# Patient Record
Sex: Female | Born: 1995 | Race: Black or African American | Hispanic: No | Marital: Single | Smoking: Never smoker
Health system: Southern US, Community
[De-identification: ages and names within clinical notes are randomized; demographics above are authoritative.]

## PROBLEM LIST (undated history)

## (undated) DIAGNOSIS — S82852S Displaced trimalleolar fracture of left lower leg, sequela: Secondary | ICD-10-CM

---

## 2020-08-19 ENCOUNTER — Ambulatory Visit (INDEPENDENT_AMBULATORY_CARE_PROVIDER_SITE_OTHER): Payer: No Typology Code available for payment source

## 2020-08-19 ENCOUNTER — Ambulatory Visit: Payer: Self-pay | Admitting: Family Medicine

## 2020-08-19 ENCOUNTER — Other Ambulatory Visit: Payer: Self-pay

## 2020-08-19 ENCOUNTER — Encounter (HOSPITAL_BASED_OUTPATIENT_CLINIC_OR_DEPARTMENT_OTHER): Payer: Self-pay | Admitting: Orthopaedic Surgery

## 2020-08-19 ENCOUNTER — Ambulatory Visit (INDEPENDENT_AMBULATORY_CARE_PROVIDER_SITE_OTHER): Payer: No Typology Code available for payment source | Admitting: Orthopaedic Surgery

## 2020-08-19 DIAGNOSIS — S82892A Other fracture of left lower leg, initial encounter for closed fracture: Secondary | ICD-10-CM | POA: Diagnosis not present

## 2020-08-19 NOTE — Progress Notes (Signed)
Office Visit Note   Patient: Carol Daugherty           Date of Birth: November 19, 1995           MRN: [id number] Visit Date: 08/19/2020              Requested by: No referring provider defined for this encounter. PCP: Pcp, No   Assessment & Plan: Visit Diagnoses:  1. Closed fracture of left ankle, initial encounter     Plan: My impression is left ankle fracture dislocation.  This will need to be surgically stabilized with external fixator as soon as possible.  We will plan for outpatient surgery and then follow-up a week after to check to swelling.  She will need definitive surgical repair once the swelling allows.  Anticipated time out of work is at least 3 to 4 months from the definitive surgery.  Risk benefits rehab recovery time reviewed with the patient.  She was placed back into the splint.  Total face to face encounter time was greater than 45 minutes and over half of this time was spent in counseling and/or coordination of care.  Follow-Up Instructions: Return for 1 week.   Orders:  Orders Placed This Encounter  Procedures   XR Ankle Complete Left   No orders of the defined types were placed in this encounter.     Procedures: No procedures performed   Clinical Data: No additional findings.   Subjective: Chief Complaint  Patient presents with   Left Ankle - Fracture    Carol Daugherty is a 25 year old female who presents here today for acute left ankle injury that she suffered couple days ago at work.  She is evaluated in the [facility] ER and placed in a splint in situ.  She comes in today to see our nurse practitioner Barnie Del who asked me to evaluate the patient.  She has been nonweightbearing since the injury.  Endorses pain and swelling in the left ankle.   Review of Systems  Constitutional: Negative.   HENT: Negative.    Eyes: Negative.   Respiratory: Negative.    Cardiovascular: Negative.   Endocrine: Negative.   Musculoskeletal: Negative.   Neurological:  Negative.   Hematological: Negative.   Psychiatric/Behavioral: Negative.    All other systems reviewed and are negative.   Objective: Vital Signs: LMP 08/05/2020 (Exact Date)   Physical Exam Vitals and nursing note reviewed.  Constitutional:      Appearance: She is well-developed.  HENT:     Head: Normocephalic and atraumatic.  Pulmonary:     Effort: Pulmonary effort is normal.  Abdominal:     Palpations: Abdomen is soft.  Musculoskeletal:     Cervical back: Neck supple.  Skin:    General: Skin is warm.     Capillary Refill: Capillary refill takes less than 2 seconds.  Neurological:     Mental Status: She is alert and oriented to person, place, and time.  Psychiatric:        Behavior: Behavior normal.        Thought Content: Thought content normal.        Judgment: Judgment normal.    Ortho Exam Left ankle shows fracture blister on the medial aspect overlying the medial malleolus.  There is no neurovascular compromise.  She has significant swelling to the foot and ankle. Specialty Comments:  No specialty comments available.  Imaging: XR Ankle Complete Left  Result Date: 08/19/2020 Dislocated ankle fracture dislocation    PMFS History: There  are no problems to display for this patient.  No past medical history on file.  No family history on file.  No past surgical history on file. Social History   Occupational History   Not on file  Tobacco Use   Smoking status: Never   Smokeless tobacco: Never  Vaping Use   Vaping Use: Never used  Substance and Sexual Activity   Alcohol use: Never   Drug use: Never   Sexual activity: Not on file

## 2020-08-20 ENCOUNTER — Ambulatory Visit (HOSPITAL_BASED_OUTPATIENT_CLINIC_OR_DEPARTMENT_OTHER): Payer: No Typology Code available for payment source | Admitting: Anesthesiology

## 2020-08-20 ENCOUNTER — Other Ambulatory Visit: Payer: Self-pay

## 2020-08-20 ENCOUNTER — Encounter (HOSPITAL_BASED_OUTPATIENT_CLINIC_OR_DEPARTMENT_OTHER)
Admission: RE | Disposition: A | Discharge status: Home / Self Care | Payer: Self-pay | Source: Home / Self Care | Attending: Orthopaedic Surgery

## 2020-08-20 ENCOUNTER — Ambulatory Visit (HOSPITAL_BASED_OUTPATIENT_CLINIC_OR_DEPARTMENT_OTHER)
Admission: RE | Admit: 2020-08-20 | Discharge: 2020-08-20 | Disposition: A | Discharge status: Home / Self Care | Payer: No Typology Code available for payment source | Attending: Orthopaedic Surgery | Admitting: Orthopaedic Surgery

## 2020-08-20 ENCOUNTER — Encounter (HOSPITAL_BASED_OUTPATIENT_CLINIC_OR_DEPARTMENT_OTHER): Payer: Self-pay | Admitting: Orthopaedic Surgery

## 2020-08-20 ENCOUNTER — Ambulatory Visit (HOSPITAL_COMMUNITY): Payer: No Typology Code available for payment source

## 2020-08-20 DIAGNOSIS — X58XXXA Exposure to other specified factors, initial encounter: Secondary | ICD-10-CM | POA: Diagnosis not present

## 2020-08-20 DIAGNOSIS — S9305XA Dislocation of left ankle joint, initial encounter: Secondary | ICD-10-CM | POA: Diagnosis not present

## 2020-08-20 DIAGNOSIS — S82852A Displaced trimalleolar fracture of left lower leg, initial encounter for closed fracture: Secondary | ICD-10-CM | POA: Insufficient documentation

## 2020-08-20 DIAGNOSIS — Z419 Encounter for procedure for purposes other than remedying health state, unspecified: Secondary | ICD-10-CM

## 2020-08-20 DIAGNOSIS — S82892A Other fracture of left lower leg, initial encounter for closed fracture: Secondary | ICD-10-CM

## 2020-08-20 HISTORY — PX: EXTERNAL FIXATION LEG: SHX1549

## 2020-08-20 SURGERY — EXTERNAL FIXATION, LOWER EXTREMITY
Anesthesia: General | Site: Ankle | Laterality: Left

## 2020-08-20 MED ORDER — FENTANYL CITRATE (PF) 100 MCG/2ML IJ SOLN
INTRAMUSCULAR | Status: AC
  Filled 2020-08-20: qty 2

## 2020-08-20 MED ORDER — AMISULPRIDE (ANTIEMETIC) 5 MG/2ML IV SOLN: 10.0000 mg | Freq: Once | INTRAVENOUS | Status: DC | PRN

## 2020-08-20 MED ORDER — MIDAZOLAM HCL 2 MG/2ML IJ SOLN
INTRAMUSCULAR | Status: AC
  Filled 2020-08-20: qty 2

## 2020-08-20 MED ORDER — PROPOFOL 10 MG/ML IV BOLUS
INTRAVENOUS | Status: DC | PRN
  Administered 2020-08-20: 200 mg via INTRAVENOUS

## 2020-08-20 MED ORDER — ONDANSETRON HCL 4 MG/2ML IJ SOLN
INTRAMUSCULAR | Status: DC | PRN
  Administered 2020-08-20: 4 mg via INTRAVENOUS

## 2020-08-20 MED ORDER — OXYCODONE HCL 5 MG PO TABS: 5.0000 mg | ORAL_TABLET | Freq: Once | ORAL | Status: DC | PRN

## 2020-08-20 MED ORDER — HYDROMORPHONE HCL 1 MG/ML IJ SOLN
INTRAMUSCULAR | Status: AC
  Filled 2020-08-20: qty 0.5

## 2020-08-20 MED ORDER — CEFAZOLIN SODIUM-DEXTROSE 2-4 GM/100ML-% IV SOLN
2.0000 g | INTRAVENOUS | Status: AC
  Administered 2020-08-20: 2 g via INTRAVENOUS

## 2020-08-20 MED ORDER — HYDROMORPHONE HCL 1 MG/ML IJ SOLN
0.2500 mg | INTRAMUSCULAR | Status: DC | PRN
  Administered 2020-08-20 (×2): 0.5 mg via INTRAVENOUS

## 2020-08-20 MED ORDER — ASPIRIN EC 81 MG PO TBEC: 81.0000 mg | DELAYED_RELEASE_TABLET | Freq: Every day | ORAL | 0 refills | Status: AC

## 2020-08-20 MED ORDER — CEFAZOLIN SODIUM-DEXTROSE 2-4 GM/100ML-% IV SOLN
INTRAVENOUS | Status: AC
  Filled 2020-08-20: qty 100

## 2020-08-20 MED ORDER — OXYCODONE HCL 5 MG/5ML PO SOLN: 5.0000 mg | Freq: Once | ORAL | Status: DC | PRN

## 2020-08-20 MED ORDER — PROMETHAZINE HCL 25 MG/ML IJ SOLN: 6.2500 mg | INTRAMUSCULAR | Status: DC | PRN

## 2020-08-20 MED ORDER — HYDROCODONE-ACETAMINOPHEN 5-325 MG PO TABS: 1.0000 | ORAL_TABLET | Freq: Four times a day (QID) | ORAL | 0 refills | Status: AC | PRN

## 2020-08-20 MED ORDER — DEXAMETHASONE SODIUM PHOSPHATE 10 MG/ML IJ SOLN
INTRAMUSCULAR | Status: DC | PRN
  Administered 2020-08-20: 10 mg via INTRAVENOUS

## 2020-08-20 MED ORDER — MIDAZOLAM HCL 5 MG/5ML IJ SOLN
INTRAMUSCULAR | Status: DC | PRN
  Administered 2020-08-20 (×2): 1 mg via INTRAVENOUS
  Administered 2020-08-20: 2 mg via INTRAVENOUS

## 2020-08-20 MED ORDER — LIDOCAINE HCL (CARDIAC) PF 100 MG/5ML IV SOSY
PREFILLED_SYRINGE | INTRAVENOUS | Status: DC | PRN
  Administered 2020-08-20: 100 mg via INTRAVENOUS

## 2020-08-20 MED ORDER — LACTATED RINGERS IV SOLN: INTRAVENOUS | Status: DC

## 2020-08-20 MED ORDER — MEPERIDINE HCL 25 MG/ML IJ SOLN: 6.2500 mg | INTRAMUSCULAR | Status: DC | PRN

## 2020-08-20 MED ORDER — LIDOCAINE HCL (PF) 2 % IJ SOLN
INTRAMUSCULAR | Status: AC
  Filled 2020-08-20: qty 5

## 2020-08-20 MED ORDER — FENTANYL CITRATE (PF) 100 MCG/2ML IJ SOLN
INTRAMUSCULAR | Status: DC | PRN
  Administered 2020-08-20 (×2): 50 ug via INTRAVENOUS
  Administered 2020-08-20: 100 ug via INTRAVENOUS

## 2020-08-20 SURGICAL SUPPLY — 89 items
BANDAGE ESMARK 6X9 LF (GAUZE/BANDAGES/DRESSINGS) IMPLANT
BAR EXFX 150X11 NS LF (EXFIX) ×2
BAR EXFX 300X11 NS LF (EXFIX) ×2
BAR GLASS FIBER EXFX 11X150 (EXFIX) ×4 IMPLANT
BAR GLASS FIBER EXFX 11X300 (EXFIX) ×4 IMPLANT
BIT DRILL CANN MED FLUTE 4.0 (BIT) ×1 IMPLANT
BLADE HEX COATED 2.75 (ELECTRODE) IMPLANT
BLADE SURG 15 STRL LF DISP TIS (BLADE) ×2 IMPLANT
BLADE SURG 15 STRL SS (BLADE) ×4
BNDG CMPR 9X6 STRL LF SNTH (GAUZE/BANDAGES/DRESSINGS)
BNDG COHESIVE 6X5 TAN STRL LF (GAUZE/BANDAGES/DRESSINGS) IMPLANT
BNDG ELASTIC 4X5.8 VLCR STR LF (GAUZE/BANDAGES/DRESSINGS) ×2 IMPLANT
BNDG ELASTIC 6X5.8 VLCR STR LF (GAUZE/BANDAGES/DRESSINGS) ×2 IMPLANT
BNDG ESMARK 6X9 LF (GAUZE/BANDAGES/DRESSINGS)
BNDG GAUZE ELAST 4 BULKY (GAUZE/BANDAGES/DRESSINGS) ×2 IMPLANT
BRUSH SCRUB EZ PLAIN DRY (MISCELLANEOUS) ×2 IMPLANT
CANISTER SUCT 1200ML W/VALVE (MISCELLANEOUS) ×2 IMPLANT
CLAMP BLUE BAR TO BAR (EXFIX) ×4 IMPLANT
CLAMP BLUE BAR TO PIN (EXFIX) ×12 IMPLANT
COVER BACK TABLE 60X90IN (DRAPES) ×2 IMPLANT
COVER MAYO STAND STRL (DRAPES) IMPLANT
CUFF TOURN SGL QUICK 34 (TOURNIQUET CUFF) ×2
CUFF TRNQT CYL 34X4.125X (TOURNIQUET CUFF) ×1 IMPLANT
DECANTER SPIKE VIAL GLASS SM (MISCELLANEOUS) IMPLANT
DRAPE C-ARM 42X72 X-RAY (DRAPES) ×2 IMPLANT
DRAPE C-ARMOR (DRAPES) ×2 IMPLANT
DRAPE EXTREMITY T 121X128X90 (DISPOSABLE) ×2 IMPLANT
DRAPE IMP U-DRAPE 54X76 (DRAPES) ×2 IMPLANT
DRAPE INCISE IOBAN 66X45 STRL (DRAPES) IMPLANT
DRAPE SURG 17X23 STRL (DRAPES) IMPLANT
DRAPE U-SHAPE 47X51 STRL (DRAPES) ×2 IMPLANT
DRILL CANN 4.0MM (BIT) ×2
DRSG PAD ABDOMINAL 8X10 ST (GAUZE/BANDAGES/DRESSINGS) IMPLANT
DURAPREP 26ML APPLICATOR (WOUND CARE) ×4 IMPLANT
ELECT REM PT RETURN 9FT ADLT (ELECTROSURGICAL)
ELECTRODE REM PT RTRN 9FT ADLT (ELECTROSURGICAL) IMPLANT
GAUZE SPONGE 4X4 12PLY STRL (GAUZE/BANDAGES/DRESSINGS) ×2 IMPLANT
GAUZE XEROFORM 1X8 LF (GAUZE/BANDAGES/DRESSINGS) IMPLANT
GAUZE XEROFORM 5X9 LF (GAUZE/BANDAGES/DRESSINGS) ×2 IMPLANT
GLOVE SURG LTX SZ7 (GLOVE) IMPLANT
GLOVE SURG NEOP MICRO LF SZ7.5 (GLOVE) ×2 IMPLANT
GLOVE SURG SYN 7.5  E (GLOVE) ×1
GLOVE SURG SYN 7.5 E (GLOVE) ×1 IMPLANT
GLOVE SURG UNDER POLY LF SZ7 (GLOVE) ×4 IMPLANT
GOWN STRL REIN XL XLG (GOWN DISPOSABLE) ×4 IMPLANT
GOWN STRL REUS W/ TWL LRG LVL3 (GOWN DISPOSABLE) ×1 IMPLANT
GOWN STRL REUS W/ TWL XL LVL3 (GOWN DISPOSABLE) IMPLANT
GOWN STRL REUS W/TWL LRG LVL3 (GOWN DISPOSABLE) ×2
GOWN STRL REUS W/TWL XL LVL3 (GOWN DISPOSABLE)
MANIFOLD NEPTUNE II (INSTRUMENTS) IMPLANT
NEEDLE HYPO 22GX1.5 SAFETY (NEEDLE) IMPLANT
NS IRRIG 1000ML POUR BTL (IV SOLUTION) ×2 IMPLANT
PACK BASIN DAY SURGERY FS (CUSTOM PROCEDURE TRAY) ×2 IMPLANT
PAD CAST 3X4 CTTN HI CHSV (CAST SUPPLIES) IMPLANT
PAD CAST 4YDX4 CTTN HI CHSV (CAST SUPPLIES) IMPLANT
PADDING CAST COTTON 3X4 STRL (CAST SUPPLIES)
PADDING CAST COTTON 4X4 STRL (CAST SUPPLIES)
PADDING CAST COTTON 6X4 STRL (CAST SUPPLIES) IMPLANT
PADDING CAST SYN 6 (CAST SUPPLIES)
PADDING CAST SYNTHETIC 4 (CAST SUPPLIES)
PADDING CAST SYNTHETIC 4X4 STR (CAST SUPPLIES) IMPLANT
PADDING CAST SYNTHETIC 6X4 NS (CAST SUPPLIES) IMPLANT
PENCIL SMOKE EVACUATOR (MISCELLANEOUS) IMPLANT
PIN HALF YELLOW 5X160X35 (EXFIX) ×4 IMPLANT
PIN TRANSFIXING 5.0 (EXFIX) ×2 IMPLANT
SHEET MEDIUM DRAPE 40X70 STRL (DRAPES) ×4 IMPLANT
SLEEVE SCD COMPRESS KNEE MED (STOCKING) ×2 IMPLANT
SPLINT FIBERGLASS 4X30 (CAST SUPPLIES) IMPLANT
SPONGE T-LAP 18X18 ~~LOC~~+RFID (SPONGE) IMPLANT
STOCKINETTE 6  STRL (DRAPES) ×1
STOCKINETTE 6 STRL (DRAPES) ×1 IMPLANT
STOCKINETTE TUBULAR 6 INCH (GAUZE/BANDAGES/DRESSINGS) ×2 IMPLANT
SUCTION FRAZIER HANDLE 10FR (MISCELLANEOUS)
SUCTION TUBE FRAZIER 10FR DISP (MISCELLANEOUS) IMPLANT
SUT ETHILON 3 0 PS 1 (SUTURE) IMPLANT
SUT VIC AB 0 CT1 27 (SUTURE)
SUT VIC AB 0 CT1 27XBRD ANBCTR (SUTURE) IMPLANT
SUT VIC AB 2-0 CT1 27 (SUTURE)
SUT VIC AB 2-0 CT1 TAPERPNT 27 (SUTURE) IMPLANT
SUT VIC AB 3-0 SH 27 (SUTURE)
SUT VIC AB 3-0 SH 27X BRD (SUTURE) IMPLANT
SYR BULB EAR ULCER 3OZ GRN STR (SYRINGE) ×2 IMPLANT
SYR CONTROL 10ML LL (SYRINGE) IMPLANT
TOWEL GREEN STERILE FF (TOWEL DISPOSABLE) ×2 IMPLANT
TRAY DSU PREP LF (CUSTOM PROCEDURE TRAY) ×2 IMPLANT
TUBE CONNECTING 20X1/4 (TUBING) IMPLANT
UNDERPAD 30X36 HEAVY ABSORB (UNDERPADS AND DIAPERS) ×2 IMPLANT
YANKAUER SUCT BULB TIP NO VENT (SUCTIONS) IMPLANT
bar to bar clamp ×4 IMPLANT

## 2020-08-20 NOTE — Transfer of Care (Signed)
Immediate Anesthesia Transfer of Care Note  Patient: Carol Daugherty  Procedure(s) Performed: EXTERNAL FIXATION LEFT ANKLE (Left: Ankle)  Patient Location: PACU  Anesthesia Type:General  Level of Consciousness: awake, alert , oriented, drowsy and patient cooperative  Airway & Oxygen Therapy: Patient Spontanous Breathing and Patient connected to face mask oxygen  Post-op Assessment: Report given to RN and Post -op Vital signs reviewed and stable  Post vital signs: Reviewed and stable  Last Vitals:  Vitals Value Taken Time  BP    Temp    Pulse    Resp    SpO2      Last Pain:  Vitals:   08/20/20 1132  TempSrc: Oral  PainSc: 8       Patients Stated Pain Goal: 6 (08/20/20 1132)  Complications: No notable events documented.

## 2020-08-20 NOTE — Op Note (Signed)
   Date of Surgery: 08/20/2020  INDICATIONS: Carol Daugherty is a 25 y.o.-year-old female who sustained a left ankle fracture dislocation and presented to my office with the ankle dislocated and severe swelling with fracture blister; she was indicated for external fixation due to the displaced and unstable nature of the fracture and came to the operating room today for this procedure. The Patient did consent to the procedure after discussion of the risks and benefits.   PREOPERATIVE DIAGNOSIS: left trimalleolar ankle fracture dislocation  POSTOPERATIVE DIAGNOSIS: Same.  PROCEDURE: External fixation left trimalleolar ankle fracture dislocation.  CPT 20692 multiplane  SURGEON: N. Glee Arvin, M.D.  ASSIST: Starlyn Skeans [place], [place]; necessary for the timely completion of procedure and due to complexity of procedure.  ANESTHESIA: general  IV FLUIDS AND URINE: See anesthesia.  ESTIMATED BLOOD LOSS: minimal mL.  IMPLANTS: Zimmer ex fix  DRAINS: None.  COMPLICATIONS: see description of procedure.  DESCRIPTION OF PROCEDURE: The patient was identified in the preoperative holding area.  The operative site was marked by the surgeon and confirmed by the patient.  He was brought back to the operating room.  Anesthesia was induced by the anesthesia team.  A well padded nonsterile tourniquet was placed. The operative extremity was prepped and draped in standard sterile fashion.  A timeout was performed.  Preoperative antibiotics were given.  The bony landmarks were palpated on the tibial crest and the pin sites were marked on the skin.  Each Schanz pin was placed in the same fashion -- first drilling with the 3.5 mm drill while copiously irrigating, then hand placing the pin.  This was confirmed on x-ray on both views.  The trans-calcaneal pin was placed from medial to lateral using fluoroscopic guidance.  The ex-fix clamps were placed onto pins and the fracture was pulled into the proper alignment.  The  clamps were completely tightened.   Final x-rays were taken in AP and lateral views to confirm the reduction and pin lengths. The wounds were cleaned and dried a final time and a sterile dressing consisting of Xeroform and kerlix was placed.  The patient was then transferred back to the bed and left the operating room in stable condition.  All sponge and instrument counts were correct.  POSTOPERATIVE PLAN: Ms. Shindler will remain non weight bearing with the leg elevated.  she will return to the operating room for definitive fixation when the swelling has gone down.  Ms. Nemmers will receive DVT prophylaxis based on other medications, activity level, and risk ratio of bleeding to thrombosis.  Pin site care will be initiated on postoperative day one.  Mayra Reel, MD [facility] 2:01 PM

## 2020-08-20 NOTE — Anesthesia Procedure Notes (Addendum)
Procedure Name: LMA Insertion Date/Time: 08/20/2020 1:21 PM Performed by: Ronnette Hila, CRNA Pre-anesthesia Checklist: Patient identified, Emergency Drugs available, Suction available and Patient being monitored Patient Re-evaluated:Patient Re-evaluated prior to induction Oxygen Delivery Method: Circle system utilized Preoxygenation: Pre-oxygenation with 100% oxygen Induction Type: IV induction Ventilation: Mask ventilation without difficulty LMA: LMA inserted LMA Size: 4.0 Number of attempts: 1 Airway Equipment and Method: Bite block Placement Confirmation: positive ETCO2 Tube secured with: Tape Dental Injury: Teeth and Oropharynx as per pre-operative assessment

## 2020-08-20 NOTE — Discharge Instructions (Signed)
  Post Anesthesia Home Care Instructions  Activity: Get plenty of rest for the remainder of the day. A responsible individual must stay with you for 24 hours following the procedure.  For the next 24 hours, DO NOT: -Drive a car -Advertising copywriter -Drink alcoholic beverages -Take any medication unless instructed by your physician -Make any legal decisions or sign important papers.  Meals: Start with liquid foods such as gelatin or soup. Progress to regular foods as tolerated. Avoid greasy, spicy, heavy foods. If nausea and/or vomiting occur, drink only clear liquids until the nausea and/or vomiting subsides. Call your physician if vomiting continues.  Special Instructions/Symptoms: Your throat may feel dry or sore from the anesthesia or the breathing tube placed in your throat during surgery. If this causes discomfort, gargle with warm salt water. The discomfort should disappear within 24 hours.  If you had a scopolamine patch placed behind your ear for the management of post- operative nausea and/or vomiting:  1. The medication in the patch is effective for 72 hours, after which it should be removed.  Wrap patch in a tissue and discard in the trash. Wash hands thoroughly with soap and water. 2. You may remove the patch earlier than 72 hours if you experience unpleasant side effects which may include dry mouth, dizziness or visual disturbances. 3. Avoid touching the patch. Wash your hands with soap and water after contact with the patch.     Leave dressing on and dry until followup appointment

## 2020-08-20 NOTE — Anesthesia Preprocedure Evaluation (Signed)
Anesthesia Evaluation  °Patient identified by MRN, date of birth, ID band °Patient awake ° ° ° °Reviewed: °Allergy & Precautions, NPO status , Patient's Chart, lab work & pertinent test results ° °Airway °Mallampati: II ° °TM Distance: >3 FB °Neck ROM: Full ° ° ° Dental °no notable dental hx. ° °  °Pulmonary °neg pulmonary ROS,  °  °Pulmonary exam normal °breath sounds clear to auscultation ° ° ° ° ° ° Cardiovascular °negative cardio ROS °Normal cardiovascular exam °Rhythm:Regular Rate:Normal ° ° °  °Neuro/Psych °negative neurological ROS ° negative psych ROS  ° GI/Hepatic °negative GI ROS, Neg liver ROS,   °Endo/Other  °negative endocrine ROS ° Renal/GU °negative Renal ROS  °negative genitourinary °  °Musculoskeletal °negative musculoskeletal ROS °(+)  ° Abdominal °(+) + obese,   °Peds °negative pediatric ROS °(+)  Hematology °negative hematology ROS °(+)   °Anesthesia Other Findings ° ° Reproductive/Obstetrics °negative OB ROS ° °  ° ° ° ° ° ° ° ° ° ° ° ° ° °  °  ° ° ° ° ° ° ° ° °Anesthesia Physical °Anesthesia Plan ° °ASA: 2 ° °Anesthesia Plan: General  ° °Post-op Pain Management:   ° °Induction: Intravenous ° °PONV Risk Score and Plan: 3 and Ondansetron, Dexamethasone, Midazolam and Treatment may vary due to age or medical condition ° °Airway Management Planned: LMA ° °Additional Equipment:  ° °Intra-op Plan:  ° °Post-operative Plan: Extubation in OR ° °Informed Consent: I have reviewed the patients History and Physical, chart, labs and discussed the procedure including the risks, benefits and alternatives for the proposed anesthesia with the patient or authorized representative who has indicated his/her understanding and acceptance.  ° ° ° °Dental advisory given ° °Plan Discussed with: CRNA ° °Anesthesia Plan Comments:   ° ° ° ° ° ° °Anesthesia Quick Evaluation ° °

## 2020-08-20 NOTE — Anesthesia Postprocedure Evaluation (Signed)
Anesthesia Post Note  Patient: Makaylyn Sinyard  Procedure(s) Performed: EXTERNAL FIXATION LEFT ANKLE (Left: Ankle)     Patient location during evaluation: PACU Anesthesia Type: General Level of consciousness: awake and alert Pain management: pain level controlled Vital Signs Assessment: post-procedure vital signs reviewed and stable Respiratory status: spontaneous breathing, nonlabored ventilation and respiratory function stable Cardiovascular status: blood pressure returned to baseline and stable Postop Assessment: no apparent nausea or vomiting Anesthetic complications: no   No notable events documented.  Last Vitals:  Vitals:   08/20/20 1445 08/20/20 1500  BP: (!) 150/91 (!) 150/96  Pulse: 92 93  Resp: 14 13  Temp:    SpO2: 96% 97%    Last Pain:  Vitals:   08/20/20 1445  TempSrc:   PainSc: 8                  Lowella Curb

## 2020-08-20 NOTE — H&P (Signed)
    PREOPERATIVE H&P  Chief Complaint: left ankle fracture dislocation  HPI: Carol Daugherty is a 25 y.o. female who presents for surgical treatment of left ankle fracture dislocation.  She denies any changes in medical history.  History reviewed. No pertinent past medical history. History reviewed. No pertinent surgical history. Social History   Socioeconomic History   Marital status: Single    Spouse name: Not on file   Number of children: Not on file   Years of education: Not on file   Highest education level: Not on file  Occupational History   Not on file  Tobacco Use   Smoking status: Never   Smokeless tobacco: Never  Vaping Use   Vaping Use: Never used  Substance and Sexual Activity   Alcohol use: Never   Drug use: Never   Sexual activity: Not on file  Other Topics Concern   Not on file  Social History Narrative   Not on file   Social Determinants of Health   Financial Resource Strain: Not on file  Food Insecurity: Not on file  Transportation Needs: Not on file  Physical Activity: Not on file  Stress: Not on file  Social Connections: Not on file   History reviewed. No pertinent family history. Allergies  Allergen Reactions   Food Hives    SEAFOOD   Prior to Admission medications   Medication Sig Start Date End Date Taking? Authorizing Provider  HYDROcodone-acetaminophen (NORCO/VICODIN) 5-325 MG tablet Take 1 tablet by mouth every 6 (six) hours as needed for moderate pain.   Yes [provider]  ondansetron (ZOFRAN) 4 MG tablet Take 4 mg by mouth every 8 (eight) hours as needed for nausea or vomiting.   Yes [provider]     Positive ROS: All other systems have been reviewed and were otherwise negative with the exception of those mentioned in the HPI and as above.  Physical Exam: General: Alert, no acute distress Cardiovascular: No pedal edema Respiratory: No cyanosis, no use of accessory musculature GI: abdomen soft Skin: No  lesions in the area of chief complaint Neurologic: Sensation intact distally Psychiatric: Patient is competent for consent with normal mood and affect Lymphatic: no lymphedema  MUSCULOSKELETAL: exam stable  Assessment: left ankle fracture dislocation  Plan: Plan for Procedure(s): EXTERNAL FIXATION LEFT ANKLE  The risks benefits and alternatives were discussed with the patient including but not limited to the risks of nonoperative treatment, versus surgical intervention including infection, bleeding, nerve injury,  blood clots, cardiopulmonary complications, morbidity, mortality, among others, and they were willing to proceed.   Preoperative templating of the joint replacement has been completed, documented, and submitted to the Operating Room personnel in order to optimize intra-operative equipment management.   Glee Arvin, MD 08/20/2020 12:57 PM

## 2020-08-21 ENCOUNTER — Encounter (HOSPITAL_BASED_OUTPATIENT_CLINIC_OR_DEPARTMENT_OTHER): Payer: Self-pay | Admitting: Orthopaedic Surgery

## 2020-08-22 ENCOUNTER — Ambulatory Visit (INDEPENDENT_AMBULATORY_CARE_PROVIDER_SITE_OTHER): Payer: No Typology Code available for payment source | Admitting: Orthopaedic Surgery

## 2020-08-22 ENCOUNTER — Encounter: Payer: Self-pay | Admitting: Orthopaedic Surgery

## 2020-08-22 DIAGNOSIS — S82892A Other fracture of left lower leg, initial encounter for closed fracture: Secondary | ICD-10-CM

## 2020-08-22 MED ORDER — "XEROFORM PETROLAT GAUZE 1""X8"" EX MISC": CUTANEOUS | 0 refills | Status: AC

## 2020-08-22 NOTE — Progress Notes (Signed)
   Post-Op Visit Note   Patient: Carol Daugherty           Date of Birth: [birth year]           MRN: [id number] Visit Date: 08/22/2020 PCP: Pcp, No   Assessment & Plan:  Chief Complaint:  Chief Complaint  Patient presents with   Left Ankle - Routine Post Op   Visit Diagnoses:  1. Closed fracture of left ankle, initial encounter     Plan: Patient follows up today 2 days status post Ex-Fix left trimalleolar ankle fracture dislocation.  She has no complaints.  Left ankle shows an intact external fixator and the pin sites are hemostatic.  There is no drainage.  Swelling has already improved.  The skin is wrinkling.  Foot is warm and well-perfused without any neurovascular compromise.  We educated the patient on pin site care which she will do twice a day.  She will continue to keep this elevated at all times.  She is to take aspirin for DVT prophylaxis.  We will plan on surgery next Wednesday for Ex-Fix removal and definitive ORIF.  Details of the surgery were reviewed with the patient and risk benefits rehab recovery reviewed.  Anticipated time out of work is at least 3 months from the definitive surgery.  Follow-Up Instructions: No follow-ups on file.   Orders:  No orders of the defined types were placed in this encounter.  Meds ordered this encounter  Medications   Bismuth Tribromoph-Petrolatum (XEROFORM PETROLAT GAUZE 1"X8") MISC    Sig: Apply to each pin site twice a day    Dispense:  10 each    Refill:  0    Imaging: No results found.  PMFS History: Patient Active Problem List   Diagnosis Date Noted   Surgery, elective    History reviewed. No pertinent past medical history.  History reviewed. No pertinent family history.  Past Surgical History:  Procedure Laterality Date   EXTERNAL FIXATION LEG Left 08/20/2020   Procedure: EXTERNAL FIXATION LEFT ANKLE;  Surgeon: Tarry Kos, MD;  Location: [facility];  Service: Orthopedics;  Laterality: Left;    Social History   Occupational History   Not on file  Tobacco Use   Smoking status: Never   Smokeless tobacco: Never  Vaping Use   Vaping Use: Never used  Substance and Sexual Activity   Alcohol use: Never   Drug use: Never   Sexual activity: Not on file

## 2020-08-25 ENCOUNTER — Encounter (HOSPITAL_BASED_OUTPATIENT_CLINIC_OR_DEPARTMENT_OTHER): Payer: Self-pay | Admitting: Orthopaedic Surgery

## 2020-08-25 ENCOUNTER — Other Ambulatory Visit: Payer: Self-pay

## 2020-08-26 ENCOUNTER — Encounter: Payer: Self-pay | Admitting: Orthopaedic Surgery

## 2020-08-27 ENCOUNTER — Ambulatory Visit (HOSPITAL_BASED_OUTPATIENT_CLINIC_OR_DEPARTMENT_OTHER): Payer: No Typology Code available for payment source | Admitting: Certified Registered"

## 2020-08-27 ENCOUNTER — Ambulatory Visit (HOSPITAL_BASED_OUTPATIENT_CLINIC_OR_DEPARTMENT_OTHER)
Admission: RE | Admit: 2020-08-27 | Discharge: 2020-08-27 | Disposition: A | Discharge status: Home / Self Care | Payer: No Typology Code available for payment source | Attending: Orthopaedic Surgery | Admitting: Orthopaedic Surgery

## 2020-08-27 ENCOUNTER — Encounter (HOSPITAL_BASED_OUTPATIENT_CLINIC_OR_DEPARTMENT_OTHER)
Admission: RE | Disposition: A | Discharge status: Home / Self Care | Payer: Self-pay | Source: Home / Self Care | Attending: Orthopaedic Surgery

## 2020-08-27 ENCOUNTER — Ambulatory Visit (HOSPITAL_COMMUNITY): Payer: Self-pay | Attending: Orthopaedic Surgery

## 2020-08-27 ENCOUNTER — Encounter (HOSPITAL_BASED_OUTPATIENT_CLINIC_OR_DEPARTMENT_OTHER): Payer: Self-pay | Admitting: Orthopaedic Surgery

## 2020-08-27 ENCOUNTER — Other Ambulatory Visit: Payer: Self-pay

## 2020-08-27 DIAGNOSIS — Z01818 Encounter for other preprocedural examination: Secondary | ICD-10-CM | POA: Insufficient documentation

## 2020-08-27 DIAGNOSIS — X58XXXA Exposure to other specified factors, initial encounter: Secondary | ICD-10-CM | POA: Diagnosis not present

## 2020-08-27 DIAGNOSIS — S82852A Displaced trimalleolar fracture of left lower leg, initial encounter for closed fracture: Secondary | ICD-10-CM | POA: Diagnosis present

## 2020-08-27 DIAGNOSIS — Z7982 Long term (current) use of aspirin: Secondary | ICD-10-CM | POA: Insufficient documentation

## 2020-08-27 DIAGNOSIS — S93432A Sprain of tibiofibular ligament of left ankle, initial encounter: Secondary | ICD-10-CM | POA: Insufficient documentation

## 2020-08-27 DIAGNOSIS — S82402A Unspecified fracture of shaft of left fibula, initial encounter for closed fracture: Secondary | ICD-10-CM | POA: Insufficient documentation

## 2020-08-27 DIAGNOSIS — Z419 Encounter for procedure for purposes other than remedying health state, unspecified: Secondary | ICD-10-CM

## 2020-08-27 HISTORY — PX: EXTERNAL FIXATION REMOVAL: SHX5040

## 2020-08-27 HISTORY — DX: Displaced trimalleolar fracture of left lower leg, sequela: S82.852S

## 2020-08-27 HISTORY — PX: ORIF ANKLE FRACTURE: SHX5408

## 2020-08-27 SURGERY — OPEN REDUCTION INTERNAL FIXATION (ORIF) ANKLE FRACTURE
Anesthesia: General | Site: Ankle | Laterality: Left

## 2020-08-27 MED ORDER — DEXAMETHASONE SODIUM PHOSPHATE 10 MG/ML IJ SOLN
INTRAMUSCULAR | Status: DC | PRN
  Administered 2020-08-27: 5 mg via INTRAVENOUS

## 2020-08-27 MED ORDER — MIDAZOLAM HCL 2 MG/2ML IJ SOLN
2.0000 mg | Freq: Once | INTRAMUSCULAR | Status: AC
  Administered 2020-08-27: 2 mg via INTRAVENOUS

## 2020-08-27 MED ORDER — CALCIUM CARBONATE-VITAMIN D 500-200 MG-UNIT PO TABS
1.0000 | ORAL_TABLET | Freq: Three times a day (TID) | ORAL | 6 refills | Status: AC
Start: 2020-08-27 — End: ?

## 2020-08-27 MED ORDER — OXYCODONE HCL 5 MG/5ML PO SOLN: 5.0000 mg | Freq: Once | ORAL | Status: DC | PRN

## 2020-08-27 MED ORDER — ONDANSETRON HCL 4 MG/2ML IJ SOLN
INTRAMUSCULAR | Status: AC
  Filled 2020-08-27: qty 2

## 2020-08-27 MED ORDER — MIDAZOLAM HCL 2 MG/2ML IJ SOLN
INTRAMUSCULAR | Status: AC
  Filled 2020-08-27: qty 2

## 2020-08-27 MED ORDER — ONDANSETRON HCL 4 MG/2ML IJ SOLN
INTRAMUSCULAR | Status: DC | PRN
  Administered 2020-08-27: 4 mg via INTRAVENOUS

## 2020-08-27 MED ORDER — ESMOLOL HCL 100 MG/10ML IV SOLN
INTRAVENOUS | Status: AC
  Filled 2020-08-27: qty 10

## 2020-08-27 MED ORDER — OXYCODONE HCL 5 MG PO TABS: 5.0000 mg | ORAL_TABLET | Freq: Once | ORAL | Status: DC | PRN

## 2020-08-27 MED ORDER — OXYCODONE-ACETAMINOPHEN 5-325 MG PO TABS: 1.0000 | ORAL_TABLET | Freq: Three times a day (TID) | ORAL | 0 refills | Status: AC | PRN

## 2020-08-27 MED ORDER — PHENYLEPHRINE HCL (PRESSORS) 10 MG/ML IV SOLN
INTRAVENOUS | Status: DC | PRN
  Administered 2020-08-27 (×7): 80 ug via INTRAVENOUS
  Administered 2020-08-27 (×2): 120 ug via INTRAVENOUS

## 2020-08-27 MED ORDER — 0.9 % SODIUM CHLORIDE (POUR BTL) OPTIME
TOPICAL | Status: DC | PRN
  Administered 2020-08-27: 1000 mL

## 2020-08-27 MED ORDER — ONDANSETRON HCL 4 MG/2ML IJ SOLN: 4.0000 mg | Freq: Once | INTRAMUSCULAR | Status: DC | PRN

## 2020-08-27 MED ORDER — CEFAZOLIN SODIUM-DEXTROSE 2-4 GM/100ML-% IV SOLN
INTRAVENOUS | Status: AC
  Filled 2020-08-27: qty 100

## 2020-08-27 MED ORDER — FENTANYL CITRATE (PF) 100 MCG/2ML IJ SOLN
INTRAMUSCULAR | Status: AC
  Filled 2020-08-27: qty 2

## 2020-08-27 MED ORDER — EPHEDRINE 5 MG/ML INJ
INTRAVENOUS | Status: AC
  Filled 2020-08-27: qty 10

## 2020-08-27 MED ORDER — ESMOLOL HCL 100 MG/10ML IV SOLN
INTRAVENOUS | Status: DC | PRN
  Administered 2020-08-27: 30 mg via INTRAVENOUS
  Administered 2020-08-27: 40 mg via INTRAVENOUS
  Administered 2020-08-27: 30 mg via INTRAVENOUS

## 2020-08-27 MED ORDER — FENTANYL CITRATE (PF) 100 MCG/2ML IJ SOLN
100.0000 ug | Freq: Once | INTRAMUSCULAR | Status: AC
  Administered 2020-08-27: 100 ug via INTRAVENOUS

## 2020-08-27 MED ORDER — LACTATED RINGERS IV SOLN: INTRAVENOUS | Status: DC

## 2020-08-27 MED ORDER — PHENYLEPHRINE 40 MCG/ML (10ML) SYRINGE FOR IV PUSH (FOR BLOOD PRESSURE SUPPORT)
PREFILLED_SYRINGE | INTRAVENOUS | Status: AC
  Filled 2020-08-27: qty 10

## 2020-08-27 MED ORDER — MIDAZOLAM HCL 5 MG/5ML IJ SOLN
INTRAMUSCULAR | Status: DC | PRN
  Administered 2020-08-27: 2 mg via INTRAVENOUS

## 2020-08-27 MED ORDER — PROPOFOL 10 MG/ML IV BOLUS
INTRAVENOUS | Status: DC | PRN
  Administered 2020-08-27: 200 mg via INTRAVENOUS

## 2020-08-27 MED ORDER — FENTANYL CITRATE (PF) 100 MCG/2ML IJ SOLN: 25.0000 ug | INTRAMUSCULAR | Status: DC | PRN

## 2020-08-27 MED ORDER — DEXAMETHASONE SODIUM PHOSPHATE 10 MG/ML IJ SOLN
INTRAMUSCULAR | Status: DC | PRN
  Administered 2020-08-27: 5 mg

## 2020-08-27 MED ORDER — ROPIVACAINE HCL 5 MG/ML IJ SOLN
INTRAMUSCULAR | Status: DC | PRN
  Administered 2020-08-27: 30 mL via PERINEURAL
  Administered 2020-08-27: 15 mL via PERINEURAL

## 2020-08-27 MED ORDER — CEPHALEXIN 500 MG PO CAPS: 500.0000 mg | ORAL_CAPSULE | Freq: Four times a day (QID) | ORAL | 0 refills | Status: AC

## 2020-08-27 MED ORDER — EPHEDRINE SULFATE 50 MG/ML IJ SOLN
INTRAMUSCULAR | Status: DC | PRN
  Administered 2020-08-27: 10 mg via INTRAVENOUS

## 2020-08-27 MED ORDER — CEFAZOLIN SODIUM-DEXTROSE 2-4 GM/100ML-% IV SOLN
2.0000 g | INTRAVENOUS | Status: AC
  Administered 2020-08-27: 2 g via INTRAVENOUS

## 2020-08-27 MED ORDER — FENTANYL CITRATE (PF) 100 MCG/2ML IJ SOLN
INTRAMUSCULAR | Status: DC | PRN
  Administered 2020-08-27 (×2): 25 ug via INTRAVENOUS
  Administered 2020-08-27: 50 ug via INTRAVENOUS
  Administered 2020-08-27 (×3): 25 ug via INTRAVENOUS

## 2020-08-27 MED ORDER — AMISULPRIDE (ANTIEMETIC) 5 MG/2ML IV SOLN: 10.0000 mg | Freq: Once | INTRAVENOUS | Status: DC | PRN

## 2020-08-27 MED ORDER — LIDOCAINE HCL (PF) 2 % IJ SOLN
INTRAMUSCULAR | Status: AC
  Filled 2020-08-27: qty 5

## 2020-08-27 MED ORDER — ONDANSETRON HCL 4 MG PO TABS: 4.0000 mg | ORAL_TABLET | Freq: Three times a day (TID) | ORAL | 0 refills | Status: AC | PRN

## 2020-08-27 SURGICAL SUPPLY — 94 items
APL PRP STRL LF DISP 70% ISPRP (MISCELLANEOUS) ×2
BANDAGE ESMARK 6X9 LF (GAUZE/BANDAGES/DRESSINGS) ×1 IMPLANT
BIT DRILL 2.4X140 LONG SOLID (BIT) ×2 IMPLANT
BIT DRILL CANNULTD 2.6 X 130MM (DRILL) ×1 IMPLANT
BIT DRILL SOLID 2.0 X 110MM (DRILL) ×1 IMPLANT
BLADE HEX COATED 2.75 (ELECTRODE) IMPLANT
BLADE SURG 15 STRL LF DISP TIS (BLADE) ×2 IMPLANT
BLADE SURG 15 STRL SS (BLADE) ×4
BNDG CMPR 9X6 STRL LF SNTH (GAUZE/BANDAGES/DRESSINGS) ×1
BNDG COHESIVE 6X5 TAN ST LF (GAUZE/BANDAGES/DRESSINGS) ×2 IMPLANT
BNDG ELASTIC 4X5.8 VLCR STR LF (GAUZE/BANDAGES/DRESSINGS) IMPLANT
BNDG ELASTIC 6X5.8 VLCR STR LF (GAUZE/BANDAGES/DRESSINGS) ×2 IMPLANT
BNDG ESMARK 6X9 LF (GAUZE/BANDAGES/DRESSINGS) ×2
BRUSH SCRUB EZ PLAIN DRY (MISCELLANEOUS) ×2 IMPLANT
CANISTER SUCT 1200ML W/VALVE (MISCELLANEOUS) ×2 IMPLANT
CHLORAPREP W/TINT 26 (MISCELLANEOUS) ×4 IMPLANT
COVER BACK TABLE 60X90IN (DRAPES) ×2 IMPLANT
COVER MAYO STAND STRL (DRAPES) IMPLANT
CUFF TOURN SGL QUICK 34 (TOURNIQUET CUFF) ×2
CUFF TRNQT CYL 34X4.125X (TOURNIQUET CUFF) ×1 IMPLANT
DECANTER SPIKE VIAL GLASS SM (MISCELLANEOUS) IMPLANT
DRAPE C-ARM 42X72 X-RAY (DRAPES) ×2 IMPLANT
DRAPE C-ARMOR (DRAPES) ×2 IMPLANT
DRAPE EXTREMITY T 121X128X90 (DISPOSABLE) ×2 IMPLANT
DRAPE IMP U-DRAPE 54X76 (DRAPES) ×2 IMPLANT
DRAPE SURG 17X23 STRL (DRAPES) ×4 IMPLANT
DRAPE U-SHAPE 47X51 STRL (DRAPES) ×2 IMPLANT
DRILL CANNULATED 2.6 X 130MM (DRILL) ×2
DRILL SOLID 2.0 X 110MM (DRILL) ×2
DRSG PAD ABDOMINAL 8X10 ST (GAUZE/BANDAGES/DRESSINGS) ×4 IMPLANT
ELECT REM PT RETURN 9FT ADLT (ELECTROSURGICAL) ×2
ELECTRODE REM PT RTRN 9FT ADLT (ELECTROSURGICAL) ×1 IMPLANT
FIXATION ZIPTIGHT ANKLE SNDSMS (Ankle) ×1 IMPLANT
GAUZE SPONGE 4X4 12PLY STRL (GAUZE/BANDAGES/DRESSINGS) ×4 IMPLANT
GAUZE XEROFORM 1X8 LF (GAUZE/BANDAGES/DRESSINGS) ×2 IMPLANT
GLOVE SURG LTX SZ7 (GLOVE) ×2 IMPLANT
GLOVE SURG NEOP MICRO LF SZ7.5 (GLOVE) ×2 IMPLANT
GLOVE SURG SYN 7.5  E (GLOVE) ×1
GLOVE SURG SYN 7.5 E (GLOVE) ×1 IMPLANT
GLOVE SURG UNDER POLY LF SZ7 (GLOVE) ×2 IMPLANT
GOWN STRL REIN XL XLG (GOWN DISPOSABLE) ×2 IMPLANT
GOWN STRL REUS W/ TWL LRG LVL3 (GOWN DISPOSABLE) ×1 IMPLANT
GOWN STRL REUS W/ TWL XL LVL3 (GOWN DISPOSABLE) ×1 IMPLANT
GOWN STRL REUS W/TWL LRG LVL3 (GOWN DISPOSABLE) ×2
GOWN STRL REUS W/TWL XL LVL3 (GOWN DISPOSABLE) ×2
K-WIRE SNGL END 1.2X150 (MISCELLANEOUS) ×4
KWIRE SNGL END 1.2X150 (MISCELLANEOUS) ×2 IMPLANT
MANIFOLD NEPTUNE II (INSTRUMENTS) ×2 IMPLANT
NEEDLE HYPO 22GX1.5 SAFETY (NEEDLE) IMPLANT
NS IRRIG 1000ML POUR BTL (IV SOLUTION) ×2 IMPLANT
PACK BASIN DAY SURGERY FS (CUSTOM PROCEDURE TRAY) ×2 IMPLANT
PAD CAST 3X4 CTTN HI CHSV (CAST SUPPLIES) IMPLANT
PAD CAST 4YDX4 CTTN HI CHSV (CAST SUPPLIES) IMPLANT
PADDING CAST COTTON 3X4 STRL (CAST SUPPLIES)
PADDING CAST COTTON 4X4 STRL (CAST SUPPLIES)
PADDING CAST COTTON 6X4 STRL (CAST SUPPLIES) IMPLANT
PADDING CAST SYN 6 (CAST SUPPLIES) ×1
PADDING CAST SYNTHETIC 4 (CAST SUPPLIES) ×1
PADDING CAST SYNTHETIC 4X4 STR (CAST SUPPLIES) ×1 IMPLANT
PADDING CAST SYNTHETIC 6X4 NS (CAST SUPPLIES) ×1 IMPLANT
PENCIL SMOKE EVACUATOR (MISCELLANEOUS) ×2 IMPLANT
PLATE FIBULAR STRT 12H (Plate) ×2 IMPLANT
SCREW 2.7 NON LOCK (Screw) ×2 IMPLANT
SCREW 2.7 X 15 NON LOCK (Screw) ×2 IMPLANT
SCREW CANN 4.0X38 SHORT THREAD (Screw) ×2 IMPLANT
SCREW CANN FT 4X38 (Screw) ×2 IMPLANT
SCREW LOCK PLATE R3 3.5X14 (Screw) ×4 IMPLANT
SCREW NL 12X2.7XNS GORILLA R (Screw) ×1 IMPLANT
SCREW NL 2.7X12 (Screw) ×2 IMPLANT
SCREW NLOCK PLATE RECON 3.5X10 (Screw) ×2 IMPLANT
SCREW NON LOCKING 3.5X12 (Screw) ×8 IMPLANT
SCREW NON LOCKING 3.5X14 (Screw) ×2 IMPLANT
SCREW NON LOCKING PLATE 2.7X14 (Screw) ×2 IMPLANT
SHEET MEDIUM DRAPE 40X70 STRL (DRAPES) ×4 IMPLANT
SLEEVE SCD COMPRESS KNEE MED (STOCKING) ×2 IMPLANT
SPLINT FIBERGLASS 4X30 (CAST SUPPLIES) ×2 IMPLANT
SPONGE T-LAP 18X18 ~~LOC~~+RFID (SPONGE) ×2 IMPLANT
STOCKINETTE 6  STRL (DRAPES) ×1
STOCKINETTE 6 STRL (DRAPES) ×1 IMPLANT
STOCKINETTE TUBULAR 6 INCH (GAUZE/BANDAGES/DRESSINGS) ×2 IMPLANT
SUCTION FRAZIER HANDLE 10FR (MISCELLANEOUS) ×1
SUCTION TUBE FRAZIER 10FR DISP (MISCELLANEOUS) ×1 IMPLANT
SUT ETHILON 3 0 PS 1 (SUTURE) ×4 IMPLANT
SUT VIC AB 2-0 CT1 27 (SUTURE) ×4
SUT VIC AB 2-0 CT1 TAPERPNT 27 (SUTURE) ×2 IMPLANT
SUT VIC AB 3-0 SH 27 (SUTURE) ×6
SUT VIC AB 3-0 SH 27X BRD (SUTURE) ×3 IMPLANT
SYR BULB EAR ULCER 3OZ GRN STR (SYRINGE) IMPLANT
TOWEL GREEN STERILE FF (TOWEL DISPOSABLE) ×4 IMPLANT
TRAY DSU PREP LF (CUSTOM PROCEDURE TRAY) ×2 IMPLANT
TUBE CONNECTING 20X1/4 (TUBING) ×2 IMPLANT
UNDERPAD 30X36 HEAVY ABSORB (UNDERPADS AND DIAPERS) ×2 IMPLANT
YANKAUER SUCT BULB TIP NO VENT (SUCTIONS) ×2 IMPLANT
ZIPTIGHT ANKLE SYNODESMOSS FIX (Ankle) ×2 IMPLANT

## 2020-08-27 NOTE — H&P (Signed)
    PREOPERATIVE H&P  Chief Complaint: left trimalleolar fracture  HPI: Carol Daugherty is a 25 y.o. female who presents for surgical treatment of left trimalleolar fracture.  She denies any changes in medical history.  Past Medical History:  Diagnosis Date   Trimalleolar fracture of ankle, open, left, sequela    Past Surgical History:  Procedure Laterality Date   EXTERNAL FIXATION LEG Left 08/20/2020   Procedure: EXTERNAL FIXATION LEFT ANKLE;  Surgeon: Tarry Kos, MD;  Location: [facility];  Service: Orthopedics;  Laterality: Left;   Social History   Socioeconomic History   Marital status: Single    Spouse name: Not on file   Number of children: Not on file   Years of education: Not on file   Highest education level: Not on file  Occupational History   Not on file  Tobacco Use   Smoking status: Never   Smokeless tobacco: Never  Vaping Use   Vaping Use: Never used  Substance and Sexual Activity   Alcohol use: Never   Drug use: Never   Sexual activity: Not on file  Other Topics Concern   Not on file  Social History Narrative   Not on file   Social Determinants of Health   Financial Resource Strain: Not on file  Food Insecurity: Not on file  Transportation Needs: Not on file  Physical Activity: Not on file  Stress: Not on file  Social Connections: Not on file   History reviewed. No pertinent family history. Allergies  Allergen Reactions   Food Hives    SEAFOOD   Prior to Admission medications   Medication Sig Start Date End Date Taking? Authorizing Provider  aspirin EC 81 MG tablet Take 1 tablet (81 mg total) by mouth daily. 08/20/20 09/19/20 Yes Tarry Kos, MD  Bismuth Tribromoph-Petrolatum (XEROFORM PETROLAT GAUZE 1"X8") MISC Apply to each pin site twice a day 08/22/20  Yes Tarry Kos, MD  HYDROcodone-acetaminophen (NORCO) 5-325 MG tablet Take 1 tablet by mouth every 6 (six) hours as needed. 08/20/20  Yes Tarry Kos, MD  ondansetron  (ZOFRAN) 4 MG tablet Take 4 mg by mouth every 8 (eight) hours as needed for nausea or vomiting.   Yes [provider]     Positive ROS: All other systems have been reviewed and were otherwise negative with the exception of those mentioned in the HPI and as above.  Physical Exam: General: Alert, no acute distress Cardiovascular: No pedal edema Respiratory: No cyanosis, no use of accessory musculature GI: abdomen soft Skin: No lesions in the area of chief complaint Neurologic: Sensation intact distally Psychiatric: Patient is competent for consent with normal mood and affect Lymphatic: no lymphedema  MUSCULOSKELETAL: exam stable  Assessment: left trimalleolar fracture  Plan: Plan for Procedure(s): OPEN REDUCTION INTERNAL FIXATION (ORIF) ANKLE FRACTURE REMOVAL OF EXTERNAL FIXATOR  The risks benefits and alternatives were discussed with the patient including but not limited to the risks of nonoperative treatment, versus surgical intervention including infection, bleeding, nerve injury,  blood clots, cardiopulmonary complications, morbidity, mortality, among others, and they were willing to proceed.   Preoperative templating of the joint replacement has been completed, documented, and submitted to the Operating Room personnel in order to optimize intra-operative equipment management.   Glee Arvin, MD 08/27/2020 10:12 AM

## 2020-08-27 NOTE — Op Note (Addendum)
Date of Surgery: 08/27/2020  INDICATIONS: Carol Daugherty is a 25 y.o.-year-old female who sustained a left ankle fracture; she was indicated for open reduction and internal fixation due to the displaced nature of the articular fracture and came to the operating room today for this procedure. The Patient did consent to the procedure after discussion of the risks and benefits.  PREOPERATIVE DIAGNOSIS:  left trimalleolar ankle fracture dislocation Left ankle syndesmosis disruption  POSTOPERATIVE DIAGNOSIS: Same.  PROCEDURE:  Open treatment of left ankle fracture with internal fixation left trimalleolar ankle fracture without fixation of posterior malleolus Removal of left ankle external fixator Excisional debridement of pin sites including bone, subcutaneous tissue Open treatment of left ankle syndesmosis rupture  SURGEON: N. Glee Arvin, M.D.  ASSIST: Oneal Grout, [place]  ANESTHESIA:  general, popliteal block  TOURNIQUET TIME: See anesthesia record  IV FLUIDS AND URINE: See anesthesia.  ESTIMATED BLOOD LOSS: Minimal mL.  IMPLANTS: [employer], [employer]omet zip tight  COMPLICATIONS: see description of procedure.  DESCRIPTION OF PROCEDURE: The patient was brought to the operating room and placed supine on the operating table.  The patient had been signed prior to the procedure and this was documented. The patient had the anesthesia placed by the anesthesiologist.  A nonsterile tourniquet was placed on the upper thigh.  The prep verification and incision time-outs were performed to confirm that this was the correct patient, site, side and location. The patient had an SCD on the opposite lower extremity. The patient did receive antibiotics prior to the incision and was re-dosed during the procedure as needed at indicated intervals.   After adequate levels of anesthesia the external fixator was first removed without any difficulty.  The extremity was then prepped and draped in standard sterile  fashion.  The extremity was exsanguinated using an esmarch bandage and the tourniquet was inflated to 300 mm Hg.  We first began by making incision on the lateral aspect of the ankle.  Full-thickness flaps were raised.  Fibula was exposed and the fracture was identified.  There was a large posterior butterfly fragment that was first reduced to the proximal fibula and a single interfragmentary screw was placed with excellent reduction and compression.  We then reduced the proximal segment to the distal segment and placed another interfragmentary screw to maintain reduction there.  There was excellent purchase and compression.  I then placed a semitubular plate on the lateral aspect of the fibula using fluoroscopic guidance.  The holes were filled with nonlocking and locking screws using fluoroscopic guidance.  We left 1 hole distal to the fracture open for the tight rope.  We then made a separate incision over the medial malleolus.  Full-thickness flaps were raised.  Neurovascular bundles were mobilized and retracted.  Fracture was identified and entrapped periosteum was removed.  Fracture was reduced and held with a tenaculum.  2 parallel K wires were placed across the fracture using fluoroscopic guidance.  Cannulated screws were then placed over the K wires for fixation.  Each screw had excellent purchase.  We then used large periarticular clamp to reduce the syndesmosis under fluoroscopy.  I then placed a single zip tight parallel to the ankle joint using fluoroscopic guidance.  This device was then tightened down.  The clamp was removed.  Final x-rays were taken.  Surgical wounds were thoroughly irrigated and closed in a layered fashion.  The external fixator pin sites were curetted out and thoroughly irrigated.  This was sharp excisional debridement that included the bone  and the subcutaneous tissue.  Sterile dressings were placed.  Short leg splint was placed.  Patient tolerated procedure well had no any  complications.  Carol Daugherty, my PA, was a medical necessity for the entirety of the surgery including opening, closing, limb positioning, retracting, exposing, and repairing.  POSTOPERATIVE PLAN: Carol Daugherty will remain nonweightbearing on this leg for approximately 6 weeks; Carol Daugherty will return for suture removal in 2 weeks.  He will be immobilized in a short leg splint and then transitioned to a CAM walker at his first follow up appointment.  Carol Daugherty will receive DVT prophylaxis based on other medications, activity level, and risk ratio of bleeding to thrombosis.  Carol Reel, MD [facility]

## 2020-08-27 NOTE — Anesthesia Preprocedure Evaluation (Signed)
Anesthesia Evaluation  Patient identified by MRN, date of birth, ID band Patient awake    Reviewed: Allergy & Precautions, NPO status , Patient's Chart, lab work & pertinent test results  History of Anesthesia Complications Negative for: history of anesthetic complications  Airway Mallampati: II  TM Distance: >3 FB Neck ROM: Full    Dental  (+) Dental Advisory Given, Teeth Intact   Pulmonary neg pulmonary ROS,    Pulmonary exam normal        Cardiovascular negative cardio ROS Normal cardiovascular exam     Neuro/Psych negative neurological ROS     GI/Hepatic negative GI ROS, Neg liver ROS,   Endo/Other  negative endocrine ROSBMI 38  Renal/GU negative Renal ROS  negative genitourinary   Musculoskeletal negative musculoskeletal ROS (+)   Abdominal   Peds  Hematology negative hematology ROS (+)   Anesthesia Other Findings   Reproductive/Obstetrics                             Anesthesia Physical Anesthesia Plan  ASA: 2  Anesthesia Plan: General   Post-op Pain Management: GA combined w/ Regional for post-op pain   Induction: Intravenous  PONV Risk Score and Plan: 3 and Ondansetron, Dexamethasone, Midazolam and Treatment may vary due to age or medical condition  Airway Management Planned: LMA  Additional Equipment: None  Intra-op Plan:   Post-operative Plan: Extubation in OR  Informed Consent: I have reviewed the patients History and Physical, chart, labs and discussed the procedure including the risks, benefits and alternatives for the proposed anesthesia with the patient or authorized representative who has indicated his/her understanding and acceptance.     Dental advisory given  Plan Discussed with:   Anesthesia Plan Comments: (Pt was unable to give urine sample for UPT. She states that she is confident she is not pregnant. I explained to her the possible risks of  anesthesia to a developing fetus and she understands and agrees to proceed without UPT.)       Anesthesia Quick Evaluation

## 2020-08-27 NOTE — Anesthesia Postprocedure Evaluation (Signed)
Anesthesia Post Note  Patient: Carol Daugherty  Procedure(s) Performed: OPEN REDUCTION INTERNAL FIXATION (ORIF) ANKLE FRACTURE (Left: Ankle) REMOVAL OF EXTERNAL FIXATOR (Left: Ankle)     Patient location during evaluation: PACU Anesthesia Type: General Level of consciousness: awake and alert Pain management: pain level controlled Vital Signs Assessment: post-procedure vital signs reviewed and stable Respiratory status: spontaneous breathing, nonlabored ventilation and respiratory function stable Cardiovascular status: blood pressure returned to baseline and stable Postop Assessment: no apparent nausea or vomiting Anesthetic complications: no   No notable events documented.  Last Vitals:  Vitals:   08/27/20 1445 08/27/20 1454  BP: 118/66 122/71  Pulse: (!) 106 (!) 105  Resp: 13 13  Temp:    SpO2: 96% 93%    Last Pain:  Vitals:   08/27/20 1445  TempSrc:   PainSc: 0-No pain                 Lucretia Kern

## 2020-08-27 NOTE — Anesthesia Procedure Notes (Signed)
Anesthesia Regional Block: Popliteal block   Pre-Anesthetic Checklist: , timeout performed,  Correct Patient, Correct Site, Correct Laterality,  Correct Procedure, Correct Position, site marked,  Risks and benefits discussed,  Surgical consent,  Pre-op evaluation,  At surgeon's request and post-op pain management  Laterality: Left  Prep: chloraprep       Needles:  Injection technique: Single-shot  Needle Type: Echogenic Stimulator Needle     Needle Length: 10cm  Needle Gauge: 20     Additional Needles:   Procedures:,,,, ultrasound used (permanent image in chart),,    Narrative:  Start time: 08/27/2020 11:08 AM End time: 08/27/2020 11:10 AM  Performed by: Personally  Anesthesiologist: Lucretia Kern, MD  Additional Notes: Standard monitors applied. Skin prepped. Good needle visualization with ultrasound. Injection made in 5cc increments with no resistance to injection. Patient tolerated the procedure well.

## 2020-08-27 NOTE — Transfer of Care (Signed)
Immediate Anesthesia Transfer of Care Note  Patient: Carol Daugherty  Procedure(s) Performed: OPEN REDUCTION INTERNAL FIXATION (ORIF) ANKLE FRACTURE (Left: Ankle) REMOVAL OF EXTERNAL FIXATOR (Left: Ankle)  Patient Location: PACU  Anesthesia Type:General and Regional  Level of Consciousness: drowsy  Airway & Oxygen Therapy: Patient Spontanous Breathing and Patient connected to face mask oxygen  Post-op Assessment: Report given to RN and Post -op Vital signs reviewed and stable  Post vital signs: Reviewed and stable  Last Vitals:  Vitals Value Taken Time  BP 114/56 08/27/20 1424  Temp    Pulse 102 08/27/20 1426  Resp 14 08/27/20 1426  SpO2 100 % 08/27/20 1426  Vitals shown include unvalidated device data.  Last Pain:  Vitals:   08/27/20 1029  TempSrc: Oral  PainSc: 6       Patients Stated Pain Goal: 1 (08/27/20 1029)  Complications: No notable events documented.

## 2020-08-27 NOTE — Anesthesia Procedure Notes (Signed)
Procedure Name: LMA Insertion Date/Time: 08/27/2020 12:16 PM Performed by: Alford Highland, CRNA Pre-anesthesia Checklist: Patient identified, Emergency Drugs available, Suction available and Patient being monitored Patient Re-evaluated:Patient Re-evaluated prior to induction Oxygen Delivery Method: Circle System Utilized Preoxygenation: Pre-oxygenation with 100% oxygen Induction Type: IV induction Ventilation: Mask ventilation without difficulty LMA: LMA inserted LMA Size: 4.0 Number of attempts: 1 Airway Equipment and Method: Bite block Placement Confirmation: positive ETCO2 Tube secured with: Tape Dental Injury: Teeth and Oropharynx as per pre-operative assessment

## 2020-08-27 NOTE — Progress Notes (Signed)
Assisted Dr. Witman with left, ultrasound guided, popliteal, adductor canal block. Side rails up, monitors on throughout procedure. See vital signs in flow sheet. Tolerated Procedure well. 

## 2020-08-27 NOTE — Anesthesia Procedure Notes (Signed)
Anesthesia Regional Block: Adductor canal block   Pre-Anesthetic Checklist: , timeout performed,  Correct Patient, Correct Site, Correct Laterality,  Correct Procedure, Correct Position, site marked,  Risks and benefits discussed,  Surgical consent,  Pre-op evaluation,  At surgeon's request and post-op pain management  Laterality: Left  Prep: chloraprep       Needles:  Injection technique: Single-shot  Needle Type: Echogenic Stimulator Needle     Needle Length: 10cm  Needle Gauge: 20     Additional Needles:   Procedures:,,,, ultrasound used (permanent image in chart),,    Narrative:  Start time: 08/27/2020 11:05 AM End time: 08/27/2020 11:08 AM Injection made incrementally with aspirations every 5 mL.  Performed by: Personally  Anesthesiologist: Lucretia Kern, MD  Additional Notes: Standard monitors applied. Skin prepped. Good needle visualization with ultrasound. Injection made in 5cc increments with no resistance to injection. Patient tolerated the procedure well.

## 2020-08-27 NOTE — Discharge Instructions (Addendum)
    1. Keep splint clean and dry 2. Elevate foot above level of the heart 3. Take aspirin to prevent blood clots 4. Take pain meds as needed 5. Strict non weight bearing to operative extremity   Post Anesthesia Home Care Instructions  Activity: Get plenty of rest for the remainder of the day. A responsible individual must stay with you for 24 hours following the procedure.  For the next 24 hours, DO NOT: -Drive a car -Operate machinery -Drink alcoholic beverages -Take any medication unless instructed by your physician -Make any legal decisions or sign important papers.  Meals: Start with liquid foods such as gelatin or soup. Progress to regular foods as tolerated. Avoid greasy, spicy, heavy foods. If nausea and/or vomiting occur, drink only clear liquids until the nausea and/or vomiting subsides. Call your physician if vomiting continues.  Special Instructions/Symptoms: Your throat may feel dry or sore from the anesthesia or the breathing tube placed in your throat during surgery. If this causes discomfort, gargle with warm salt water. The discomfort should disappear within 24 hours.  If you had a scopolamine patch placed behind your ear for the management of post- operative nausea and/or vomiting:  1. The medication in the patch is effective for 72 hours, after which it should be removed.  Wrap patch in a tissue and discard in the trash. Wash hands thoroughly with soap and water. 2. You may remove the patch earlier than 72 hours if you experience unpleasant side effects which may include dry mouth, dizziness or visual disturbances. 3. Avoid touching the patch. Wash your hands with soap and water after contact with the patch.  Regional Anesthesia Blocks  1. Numbness or the inability to move the "blocked" extremity may last from 3-48 hours after placement. The length of time depends on the medication injected and your individual response to the medication. If the numbness is not  going away after 48 hours, call your surgeon.  2. The extremity that is blocked will need to be protected until the numbness is gone and the  Strength has returned. Because you cannot feel it, you will need to take extra care to avoid injury. Because it may be weak, you may have difficulty moving it or using it. You may not know what position it is in without looking at it while the block is in effect.  3. For blocks in the legs and feet, returning to weight bearing and walking needs to be done carefully. You will need to wait until the numbness is entirely gone and the strength has returned. You should be able to move your leg and foot normally before you try and bear weight or walk. You will need someone to be with you when you first try to ensure you do not fall and possibly risk injury.  4. Bruising and tenderness at the needle site are common side effects and will resolve in a few days.  5. Persistent numbness or new problems with movement should be communicated to the surgeon or the [facility] ([phone])/ [facility] ([phone]).      

## 2020-08-28 ENCOUNTER — Encounter (HOSPITAL_BASED_OUTPATIENT_CLINIC_OR_DEPARTMENT_OTHER): Payer: Self-pay | Admitting: Orthopaedic Surgery

## 2020-09-03 ENCOUNTER — Ambulatory Visit (INDEPENDENT_AMBULATORY_CARE_PROVIDER_SITE_OTHER): Payer: No Typology Code available for payment source

## 2020-09-03 ENCOUNTER — Ambulatory Visit (INDEPENDENT_AMBULATORY_CARE_PROVIDER_SITE_OTHER): Payer: No Typology Code available for payment source | Admitting: Orthopaedic Surgery

## 2020-09-03 ENCOUNTER — Encounter: Payer: Self-pay | Admitting: Orthopaedic Surgery

## 2020-09-03 DIAGNOSIS — M25572 Pain in left ankle and joints of left foot: Secondary | ICD-10-CM

## 2020-09-03 DIAGNOSIS — S82892A Other fracture of left lower leg, initial encounter for closed fracture: Secondary | ICD-10-CM | POA: Diagnosis not present

## 2020-09-03 NOTE — Progress Notes (Signed)
   Post-Op Visit Note   Patient: Carol Daugherty           Date of Birth: 06-12-1995           MRN: [id number] Visit Date: 09/03/2020 PCP: Pcp, No   Assessment & Plan:  Chief Complaint:  Chief Complaint  Patient presents with   Left Ankle - Routine Post Op, Pain   Visit Diagnoses:  1. Pain in left ankle and joints of left foot   2. Closed fracture of left ankle, initial encounter     Plan: Ms. Sytsma is 1 week status post ORIF left ankle fracture syndesmosis and removal of external fixator.  She has been ambulating in wheelchair.  She has some swelling but overall she is doing well and pain is well controlled.  Left ankle and leg show intact surgical incisions.  No signs of infection.  The pin sites are granulating well.  Moderate swelling.  No neurovascular compromise.  X-rays demonstrate stable fixation alignment of the fracture and the syndesmosis.  We will place her back into a short leg splint.  Continue nonweightbearing.  She will need home health physical therapy for home safety evaluation plus ramp and knee scooter.  She will need to remain out of work for at least 6 weeks from the time of surgery.  Continue aspirin for DVT prophylaxis.  Follow-up next week for suture removal.  Follow-Up Instructions: Return in about 1 week (around 09/10/2020).   Orders:  Orders Placed This Encounter  Procedures   XR Ankle Complete Left   No orders of the defined types were placed in this encounter.   Imaging: XR Ankle Complete Left  Result Date: 09/03/2020 Stable fixation alignment of ankle fracture and syndesmosis   PMFS History: Patient Active Problem List   Diagnosis Date Noted   Closed displaced trimalleolar fracture of left lower leg    Surgery, elective    Past Medical History:  Diagnosis Date   Trimalleolar fracture of ankle, open, left, sequela     History reviewed. No pertinent family history.  Past Surgical History:  Procedure Laterality Date   EXTERNAL FIXATION  LEG Left 08/20/2020   Procedure: EXTERNAL FIXATION LEFT ANKLE;  Surgeon: Tarry Kos, MD;  Location: [facility];  Service: Orthopedics;  Laterality: Left;   EXTERNAL FIXATION REMOVAL Left 08/27/2020   Procedure: REMOVAL OF EXTERNAL FIXATOR;  Surgeon: Tarry Kos, MD;  Location: [facility];  Service: Orthopedics;  Laterality: Left;   ORIF ANKLE FRACTURE Left 08/27/2020   Procedure: OPEN REDUCTION INTERNAL FIXATION (ORIF) ANKLE FRACTURE;  Surgeon: Tarry Kos, MD;  Location: [facility];  Service: Orthopedics;  Laterality: Left;   Social History   Occupational History   Not on file  Tobacco Use   Smoking status: Never   Smokeless tobacco: Never  Vaping Use   Vaping Use: Never used  Substance and Sexual Activity   Alcohol use: Never   Drug use: Never   Sexual activity: Not on file

## 2020-09-11 ENCOUNTER — Encounter: Payer: Self-pay | Admitting: Orthopaedic Surgery

## 2020-09-11 ENCOUNTER — Other Ambulatory Visit: Payer: Self-pay

## 2020-09-11 ENCOUNTER — Ambulatory Visit (INDEPENDENT_AMBULATORY_CARE_PROVIDER_SITE_OTHER): Payer: No Typology Code available for payment source | Admitting: Physician Assistant

## 2020-09-11 DIAGNOSIS — S82852D Displaced trimalleolar fracture of left lower leg, subsequent encounter for closed fracture with routine healing: Secondary | ICD-10-CM

## 2020-09-11 NOTE — Progress Notes (Signed)
   Post-Op Visit Note   Patient: Carol Daugherty           Date of Birth: Mar 15, 1995           MRN: [id number] Visit Date: 09/11/2020 PCP: Pcp, No   Assessment & Plan:  Chief Complaint:  Chief Complaint  Patient presents with   Left Ankle - Routine Post Op, Follow-up   Visit Diagnoses:  1. Closed displaced trimalleolar fracture of left ankle with routine healing, subsequent encounter     Plan: Patient is a pleasant 25 year old female who comes in today 2 weeks status post left ankle Ex-Fix removal and ORIF trimalleolar fracture with syndesmosis fixation, date of surgery 08/27/2020.  She has been doing well.  She has been compliant nonweightbearing.  She has been elevating for pain and swelling.  She is not taking anything for pain.  She is taking aspirin for DVT prophylaxis.  Examination of her left ankle reveals well-healing surgical incisions without evidence of infection or cellulitis.  Calf is soft nontender.  Minimal swelling.  She is neurovascular intact distally.  Today, sutures were removed and Steri-Strips applied.  She was placed in a cam walker nonweightbearing.  She may open up the cam walker to ice her ankle and to work on alphabet stretches with the toes.  She will follow-up with [place] in 4 weeks time for repeat evaluation and three-view x-rays of the left ankle.  Call with concerns or questions in the meantime.  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  We will provide her with a work note today to be out of work for another 4 weeks.  Call with concerns or questions in the meantime.  Follow-Up Instructions: Return in about 4 weeks (around 10/09/2020).   Orders:  No orders of the defined types were placed in this encounter.  No orders of the defined types were placed in this encounter.   Imaging: No new imaging  PMFS History: Patient Active Problem List   Diagnosis Date Noted   Closed displaced trimalleolar fracture of left lower leg    Surgery, elective    Past  Medical History:  Diagnosis Date   Trimalleolar fracture of ankle, open, left, sequela     History reviewed. No pertinent family history.  Past Surgical History:  Procedure Laterality Date   EXTERNAL FIXATION LEG Left 08/20/2020   Procedure: EXTERNAL FIXATION LEFT ANKLE;  Surgeon: Tarry Kos, MD;  Location: [facility];  Service: Orthopedics;  Laterality: Left;   EXTERNAL FIXATION REMOVAL Left 08/27/2020   Procedure: REMOVAL OF EXTERNAL FIXATOR;  Surgeon: Tarry Kos, MD;  Location: [facility];  Service: Orthopedics;  Laterality: Left;   ORIF ANKLE FRACTURE Left 08/27/2020   Procedure: OPEN REDUCTION INTERNAL FIXATION (ORIF) ANKLE FRACTURE;  Surgeon: Tarry Kos, MD;  Location: [facility];  Service: Orthopedics;  Laterality: Left;   Social History   Occupational History   Not on file  Tobacco Use   Smoking status: Never   Smokeless tobacco: Never  Vaping Use   Vaping Use: Never used  Substance and Sexual Activity   Alcohol use: Never   Drug use: Never   Sexual activity: Not on file

## 2020-10-09 ENCOUNTER — Encounter: Payer: Self-pay | Admitting: Orthopaedic Surgery

## 2020-10-16 ENCOUNTER — Encounter: Payer: Self-pay | Admitting: Orthopaedic Surgery

## 2020-10-28 ENCOUNTER — Ambulatory Visit (INDEPENDENT_AMBULATORY_CARE_PROVIDER_SITE_OTHER): Payer: No Typology Code available for payment source | Admitting: Orthopaedic Surgery

## 2020-10-28 ENCOUNTER — Other Ambulatory Visit: Payer: Self-pay

## 2020-10-28 ENCOUNTER — Encounter: Payer: Self-pay | Admitting: Orthopaedic Surgery

## 2020-10-28 ENCOUNTER — Ambulatory Visit (INDEPENDENT_AMBULATORY_CARE_PROVIDER_SITE_OTHER): Payer: No Typology Code available for payment source

## 2020-10-28 DIAGNOSIS — S82852D Displaced trimalleolar fracture of left lower leg, subsequent encounter for closed fracture with routine healing: Secondary | ICD-10-CM | POA: Diagnosis not present

## 2020-10-28 NOTE — Progress Notes (Signed)
   Post-Op Visit Note   Patient: Carol Daugherty           Date of Birth: 04-20-1995           MRN: [id number] Visit Date: 10/28/2020 PCP: Pcp, No   Assessment & Plan:  Chief Complaint:  Chief Complaint  Patient presents with   Left Ankle - Pain   Visit Diagnoses:  1. Closed displaced trimalleolar fracture of left ankle with routine healing, subsequent encounter     Plan: [name] is approximately 6 weeks status post ORIF left trimalleolar ankle fracture syndesmosis injury.  She is doing well overall.  No real complaints today.  Left ankle shows fully healed surgical scars.  No signs of infection.  Moderate limitation range of motion secondary to immobilization and swelling.  The x-rays do show that the fractures continue to heal and the hardware is intact without any complications.  Syndesmosis intact as well.  At this point we will advance her to partial weightbearing 25% with physical therapy.  She should also continue with ankle range of motion exercises.  This was all discussed with the case manager today.  She may work from home at a desk if it is available but otherwise she would have to be out of work.  Recheck in 4 weeks with three-view x-rays of the left ankle.  Follow-Up Instructions: Return in about 4 weeks (around 11/25/2020).   Orders:  Orders Placed This Encounter  Procedures   XR Ankle Complete Left   No orders of the defined types were placed in this encounter.   Imaging: XR Ankle Complete Left  Result Date: 10/28/2020 X-rays demonstrate consolidation of fractures with stable hardware.   PMFS History: Patient Active Problem List   Diagnosis Date Noted   Closed displaced trimalleolar fracture of left lower leg    Surgery, elective    Past Medical History:  Diagnosis Date   Trimalleolar fracture of ankle, open, left, sequela     History reviewed. No pertinent family history.  Past Surgical History:  Procedure Laterality Date   EXTERNAL FIXATION  LEG Left 08/20/2020   Procedure: EXTERNAL FIXATION LEFT ANKLE;  Surgeon: Tarry Kos, MD;  Location: [facility];  Service: Orthopedics;  Laterality: Left;   EXTERNAL FIXATION REMOVAL Left 08/27/2020   Procedure: REMOVAL OF EXTERNAL FIXATOR;  Surgeon: Tarry Kos, MD;  Location: [facility];  Service: Orthopedics;  Laterality: Left;   ORIF ANKLE FRACTURE Left 08/27/2020   Procedure: OPEN REDUCTION INTERNAL FIXATION (ORIF) ANKLE FRACTURE;  Surgeon: Tarry Kos, MD;  Location: [facility];  Service: Orthopedics;  Laterality: Left;   Social History   Occupational History   Not on file  Tobacco Use   Smoking status: Never   Smokeless tobacco: Never  Vaping Use   Vaping Use: Never used  Substance and Sexual Activity   Alcohol use: Never   Drug use: Never   Sexual activity: Not on file

## 2020-11-25 ENCOUNTER — Ambulatory Visit (INDEPENDENT_AMBULATORY_CARE_PROVIDER_SITE_OTHER): Payer: Medicaid - Out of State | Admitting: Orthopaedic Surgery

## 2020-11-25 ENCOUNTER — Other Ambulatory Visit: Payer: Self-pay

## 2020-11-25 ENCOUNTER — Ambulatory Visit: Payer: Self-pay

## 2020-11-25 DIAGNOSIS — S82852D Displaced trimalleolar fracture of left lower leg, subsequent encounter for closed fracture with routine healing: Secondary | ICD-10-CM

## 2020-11-25 NOTE — Progress Notes (Signed)
Post-Op Visit Note   Patient: Carol Daugherty           Date of Birth: Jul 17, 1995           MRN: [id number] Visit Date: 11/25/2020 PCP: Pcp, No   Assessment & Plan:  Chief Complaint:  Chief Complaint  Patient presents with   Left Ankle - Follow-up    ORIF left trimalleolar ankle fracture 08/27/2020   Visit Diagnoses:  1. Closed displaced trimalleolar fracture of left ankle with routine healing, subsequent encounter     Plan: Carol Daugherty is approximately 12 weeks status post ORIF left trimalleolar ankle fracture and syndesmosis disruption.  This is a workers comp case.  Overall she is doing okay and she has started bearing more weight to the left foot.  She endorses increased discomfort with cold weather.  She has some increased swelling with standing.  Denies any severe pain.  She is been out of work since the surgery.  Left ankle surgical scars are all fully healed.  No signs of infection.  Minimal swelling.  Dorsiflexion of the ankle to neutral.  Good plantarflexion, inversion, eversion.  No tenderness to the surgical scars.  No neurovascular compromise.  The x-rays have demonstrated that the fractures are all healed.  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  From my standpoint Carol Daugherty is doing well and she has her first physical therapy appointment scheduled for tomorrow.  She is advanced to weight-bear as tolerated.  She may work from home or do desk duty at the office if this is available.  She has a handicap placard already.  She will work on gait training, ambulation, weaning crutches over the next couple months.  Recheck in 2 months with three-view x-rays of the left ankle.  Case manager present today.  Follow-Up Instructions: Return in about 2 months (around 01/25/2021).   Orders:  Orders Placed This Encounter  Procedures   XR Ankle Complete Left   No orders of the defined types were placed in this encounter.   Imaging: XR Ankle Complete Left  Result Date:  11/25/2020 The ankle fractures have demonstrated healing and consolidation.  There is no hardware complications.  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  No acute abnormalities.   PMFS History: Patient Active Problem List   Diagnosis Date Noted   Closed displaced trimalleolar fracture of left lower leg    Surgery, elective    Past Medical History:  Diagnosis Date   Trimalleolar fracture of ankle, open, left, sequela     No family history on file.  Past Surgical History:  Procedure Laterality Date   EXTERNAL FIXATION LEG Left 08/20/2020   Procedure: EXTERNAL FIXATION LEFT ANKLE;  Surgeon: Tarry Kos, MD;  Location: [facility];  Service: Orthopedics;  Laterality: Left;   EXTERNAL FIXATION REMOVAL Left 08/27/2020   Procedure: REMOVAL OF EXTERNAL FIXATOR;  Surgeon: Tarry Kos, MD;  Location:  SUR[facility]ERY [facility];  Service: Orthopedics;  Laterality: Left;   ORIF ANKLE FRACTURE Left 08/27/2020   Procedure: OPEN REDUCTION INTERNAL FIXATION (ORIF) ANKLE FRACTURE;  Surgeon: Tarry Kos, MD;  Location:  SUR[facility]ERY [facility];  Service: Orthopedics;  Laterality: Left;   Social History   Occupational History   Not on file  Tobacco Use   Smoking status: Never   Smokeless tobacco: Never  Vaping Use   Vaping Use: Never used  Substance and Sexual Activity   Alcohol use: Never   Drug use: Never  Sexual activity: Not on file

## 2020-12-04 ENCOUNTER — Encounter: Payer: Self-pay | Admitting: Orthopaedic Surgery

## 2020-12-05 NOTE — Telephone Encounter (Signed)
States she is still having pain & swelling.  +PT  twice a week. +Pain- Takes Tylenol but is not helping.   Please advise.

## 2020-12-09 ENCOUNTER — Encounter: Payer: Self-pay | Admitting: Orthopaedic Surgery

## 2020-12-09 ENCOUNTER — Other Ambulatory Visit: Payer: Self-pay

## 2020-12-09 ENCOUNTER — Ambulatory Visit (INDEPENDENT_AMBULATORY_CARE_PROVIDER_SITE_OTHER): Payer: No Typology Code available for payment source | Admitting: Orthopaedic Surgery

## 2020-12-09 DIAGNOSIS — S82852D Displaced trimalleolar fracture of left lower leg, subsequent encounter for closed fracture with routine healing: Secondary | ICD-10-CM | POA: Diagnosis not present

## 2020-12-09 NOTE — Progress Notes (Signed)
   Office Visit Note   Patient: Carol Daugherty           Date of Birth: 1995-05-12           MRN: [id number] Visit Date: 12/09/2020              Requested by: No referring provider defined for this encounter. PCP: Pcp, No   Assessment & Plan: Visit Diagnoses:  1. Closed displaced trimalleolar fracture of left ankle with routine healing, subsequent encounter     Plan: I reviewed the light duty job description in detail and it looks like it is too physically demanding for her at this time in her recovery therefore unguinal write her out of work for the next 6 weeks so that she can get stronger through physical therapy.  I anticipate that she will need work conditioning in the future.  Questions encouraged and answered.  Case manager present today.  Follow-Up Instructions: Return in about 6 weeks (around 01/20/2021).   Orders:  No orders of the defined types were placed in this encounter.  No orders of the defined types were placed in this encounter.     Procedures: No procedures performed   Clinical Data: No additional findings.   Subjective: Chief Complaint  Patient presents with   Left Ankle - Pain    HPI  Carol Daugherty returns today for left ankle swelling and pain since she started work on Monday.  She has been having difficulty doing the light duty.  Review of Systems   Objective: Vital Signs: There were no vitals taken for this visit.  Physical Exam  Ortho Exam  Left ankle shows mild swelling with pain with passive range of motion of the ankle joint and subtalar joint.  No evidence of infection.  Calf is nontender.  Specialty Comments:  No specialty comments available.  Imaging: No results found.   PMFS History: Patient Active Problem List   Diagnosis Date Noted   Closed displaced trimalleolar fracture of left lower leg    Surgery, elective    Past Medical History:  Diagnosis Date   Trimalleolar fracture of ankle, open, left, sequela      History reviewed. No pertinent family history.  Past Surgical History:  Procedure Laterality Date   EXTERNAL FIXATION LEG Left 08/20/2020   Procedure: EXTERNAL FIXATION LEFT ANKLE;  Surgeon: Tarry Kos, MD;  Location: [facility];  Service: Orthopedics;  Laterality: Left;   EXTERNAL FIXATION REMOVAL Left 08/27/2020   Procedure: REMOVAL OF EXTERNAL FIXATOR;  Surgeon: Tarry Kos, MD;  Location: [facility];  Service: Orthopedics;  Laterality: Left;   ORIF ANKLE FRACTURE Left 08/27/2020   Procedure: OPEN REDUCTION INTERNAL FIXATION (ORIF) ANKLE FRACTURE;  Surgeon: Tarry Kos, MD;  Location: [facility];  Service: Orthopedics;  Laterality: Left;   Social History   Occupational History   Not on file  Tobacco Use   Smoking status: Never   Smokeless tobacco: Never  Vaping Use   Vaping Use: Never used  Substance and Sexual Activity   Alcohol use: Never   Drug use: Never   Sexual activity: Not on file

## 2021-01-18 ENCOUNTER — Encounter: Payer: Self-pay | Admitting: Orthopaedic Surgery

## 2021-01-19 ENCOUNTER — Telehealth: Payer: Self-pay | Admitting: Orthopaedic Surgery

## 2021-01-19 DIAGNOSIS — S82852D Displaced trimalleolar fracture of left lower leg, subsequent encounter for closed fracture with routine healing: Secondary | ICD-10-CM

## 2021-01-19 NOTE — Telephone Encounter (Signed)
yes

## 2021-01-19 NOTE — Telephone Encounter (Signed)
Please advise 

## 2021-01-19 NOTE — Telephone Encounter (Signed)
Note emailed per patient request. Referral for PT entered and sent to [facility] for Work Comp approval.

## 2021-01-19 NOTE — Telephone Encounter (Signed)
Could you enter note for me?

## 2021-01-19 NOTE — Telephone Encounter (Signed)
Regarding the response below she would like to know if we can send the note to her via  email

## 2021-01-19 NOTE — Telephone Encounter (Signed)
Yes that's fine to release her. Thanks.

## 2021-01-19 NOTE — Telephone Encounter (Signed)
Patient called needing a note to return to work tomorrow. Patient said she also would like to return to (PT)  The number to contact patient is [phone]

## 2021-01-27 ENCOUNTER — Ambulatory Visit: Payer: Medicaid - Out of State | Admitting: Orthopaedic Surgery

## 2021-02-17 ENCOUNTER — Ambulatory Visit: Payer: Medicaid - Out of State | Admitting: Orthopaedic Surgery

## 2021-02-19 ENCOUNTER — Telehealth: Payer: Self-pay

## 2021-02-19 NOTE — Telephone Encounter (Signed)
yes

## 2021-02-19 NOTE — Telephone Encounter (Signed)
Pt called stating she needs a new note for returning back to work the note needs to say that she patient can go back to work but only preform light duty. Patient stated that she would like it emailed to her if so

## 2021-02-20 NOTE — Telephone Encounter (Signed)
Sent through mychart

## 2021-02-25 ENCOUNTER — Encounter: Payer: Self-pay | Admitting: Orthopaedic Surgery

## 2021-02-25 ENCOUNTER — Ambulatory Visit (INDEPENDENT_AMBULATORY_CARE_PROVIDER_SITE_OTHER): Payer: No Typology Code available for payment source | Admitting: Orthopaedic Surgery

## 2021-02-25 ENCOUNTER — Other Ambulatory Visit: Payer: Self-pay

## 2021-02-25 DIAGNOSIS — S82852D Displaced trimalleolar fracture of left lower leg, subsequent encounter for closed fracture with routine healing: Secondary | ICD-10-CM | POA: Diagnosis not present

## 2021-02-25 NOTE — Progress Notes (Signed)
° °  Post-Op Visit Note   Patient: Carol Daugherty           Date of Birth: Dec 28, 1995           MRN: [id number] Visit Date: 02/25/2021 PCP: Pcp, No   Assessment & Plan:  Chief Complaint:  Chief Complaint  Patient presents with   Left Ankle - Pain   Visit Diagnoses:  1. Closed displaced trimalleolar fracture of left ankle with routine healing, subsequent encounter     Plan: Patient is a pleasant 26 year old female who comes in today following work comp injury.  She is approximately 6 months status post ORIF left ankle trimalleolar fracture, date of surgery 08/27/2020.  She has been in physical therapy working on work conditioning which she states is not really helping.  She feels as though she can do some stretches on her own at home.  She did return back to work light duty this past Monday and has not had any trouble.  Her only complaint is slight stiffness to the ankle when she stands after she has been sitting for a long period of time.  Examination of the left ankle reveals fully healed surgical scars without complication.  She can dorsiflex to neutral.  No bony tenderness.  She is neurovascular intact distally.  At this point, she will continue with her home exercise program.  She will continue with light duty for another 2 months.  Follow-up with [place] at that time for repeat evaluation and hopeful return to full duty.  If she is not ready for full duty at that time, I think it is reasonable to obtain an FCE.  This was all discussed with the nurse case manager.  Call with concerns or questions in the meantime.  Follow-Up Instructions: Return in about 2 months (around 04/25/2021).   Orders:  No orders of the defined types were placed in this encounter.  No orders of the defined types were placed in this encounter.   Imaging: No new imaging  PMFS History: Patient Active Problem List   Diagnosis Date Noted   Closed displaced trimalleolar fracture of left lower leg    Surgery, elective     Past Medical History:  Diagnosis Date   Trimalleolar fracture of ankle, open, left, sequela     History reviewed. No pertinent family history.  Past Surgical History:  Procedure Laterality Date   EXTERNAL FIXATION LEG Left 08/20/2020   Procedure: EXTERNAL FIXATION LEFT ANKLE;  Surgeon: Tarry Kos, MD;  Location: [facility];  Service: Orthopedics;  Laterality: Left;   EXTERNAL FIXATION REMOVAL Left 08/27/2020   Procedure: REMOVAL OF EXTERNAL FIXATOR;  Surgeon: Tarry Kos, MD;  Location: [facility];  Service: Orthopedics;  Laterality: Left;   ORIF ANKLE FRACTURE Left 08/27/2020   Procedure: OPEN REDUCTION INTERNAL FIXATION (ORIF) ANKLE FRACTURE;  Surgeon: Tarry Kos, MD;  Location: [facility];  Service: Orthopedics;  Laterality: Left;   Social History   Occupational History   Not on file  Tobacco Use   Smoking status: Never   Smokeless tobacco: Never  Vaping Use   Vaping Use: Never used  Substance and Sexual Activity   Alcohol use: Never   Drug use: Never   Sexual activity: Not on file

## 2021-04-06 ENCOUNTER — Telehealth: Payer: Self-pay

## 2021-04-06 NOTE — Telephone Encounter (Signed)
Patient called asking if we have heard anything from her case worker?

## 2021-04-07 NOTE — Telephone Encounter (Signed)
Called patient no answer. Unable to leave VM.  Need to know what she needs?

## 2021-04-14 ENCOUNTER — Ambulatory Visit: Payer: Medicaid - Out of State | Admitting: Orthopaedic Surgery

## 2021-04-21 ENCOUNTER — Ambulatory Visit (INDEPENDENT_AMBULATORY_CARE_PROVIDER_SITE_OTHER): Payer: No Typology Code available for payment source | Admitting: Orthopaedic Surgery

## 2021-04-21 ENCOUNTER — Encounter: Payer: Self-pay | Admitting: Orthopaedic Surgery

## 2021-04-21 ENCOUNTER — Other Ambulatory Visit: Payer: Self-pay

## 2021-04-21 DIAGNOSIS — S82852D Displaced trimalleolar fracture of left lower leg, subsequent encounter for closed fracture with routine healing: Secondary | ICD-10-CM | POA: Diagnosis not present

## 2021-04-21 NOTE — Progress Notes (Signed)
? ?  Post-Op Visit Note ?  ?Patient: Carol Daugherty           ?Date of Birth: July 10, 1995           ?MRN: [id number] ?Visit Date: 04/21/2021 ?PCP: Pcp, No ? ? ?Assessment & Plan: ? ?Chief Complaint:  ?Chief Complaint  ?Patient presents with  ? Left Ankle - Follow-up  ?  S/p left ankle surgery 08/17/2020  ? ?Visit Diagnoses:  ?1. Closed displaced trimalleolar fracture of left ankle with routine healing, subsequent encounter   ? ? ?Plan: Patient is a pleasant 26 year old female who comes in today approximately 8 months status post ORIF left trimalleolar ankle fracture, date of surgery 08/27/2020.  This is being filed under work comp.  She has been on light duty at work and has been working on a home exercise program.  She still complains of slight pain but mostly swelling if she is sitting or standing for a long time.  She does not feel ready to return to full duty.  Examination of the left ankle reveals no swelling.  Dorsiflexion to neutral.  Minimal medial sided tenderness.  She is neurovascular intact distally.  At this point, I feel it is appropriate to order a functional capacity exam.  Hardcopy provided to the case manager.  Patient will continue with light duty until after the exam has been completed and she follows up with [place] to discuss results. ? ?Follow-Up Instructions: Return for after FCE.  ? ?Orders:  ?No orders of the defined types were placed in this encounter. ? ?No orders of the defined types were placed in this encounter. ? ? ?Imaging: ?No new imaging ? ?PMFS History: ?Patient Active Problem List  ? Diagnosis Date Noted  ? Closed displaced trimalleolar fracture of left lower leg   ? Surgery, elective   ? ?Past Medical History:  ?Diagnosis Date  ? Trimalleolar fracture of ankle, open, left, sequela   ?  ?No family history on file.  ?Past Surgical History:  ?Procedure Laterality Date  ? EXTERNAL FIXATION LEG Left 08/20/2020  ? Procedure: EXTERNAL FIXATION LEFT ANKLE;  Surgeon: Tarry Kos, MD;  Location:  [facility];  Service: Orthopedics;  Laterality: Left;  ? EXTERNAL FIXATION REMOVAL Left 08/27/2020  ? Procedure: REMOVAL OF EXTERNAL FIXATOR;  Surgeon: Tarry Kos, MD;  Location: [facility];  Service: Orthopedics;  Laterality: Left;  ? ORIF ANKLE FRACTURE Left 08/27/2020  ? Procedure: OPEN REDUCTION INTERNAL FIXATION (ORIF) ANKLE FRACTURE;  Surgeon: Tarry Kos, MD;  Location:  [facility];  Service: Orthopedics;  Laterality: Left;  ? ?Social History  ? ?Occupational History  ? Not on file  ?Tobacco Use  ? Smoking status: Never  ? Smokeless tobacco: Never  ?Vaping Use  ? Vaping Use: Never used  ?Substance and Sexual Activity  ? Alcohol use: Never  ? Drug use: Never  ? Sexual activity: Not on file  ? ? ? ?

## 2021-04-28 ENCOUNTER — Ambulatory Visit: Payer: Medicaid - Out of State | Admitting: Orthopaedic Surgery

## 2021-06-18 ENCOUNTER — Encounter: Payer: Self-pay | Admitting: Orthopaedic Surgery

## 2021-06-23 ENCOUNTER — Ambulatory Visit (INDEPENDENT_AMBULATORY_CARE_PROVIDER_SITE_OTHER): Payer: No Typology Code available for payment source | Admitting: Orthopaedic Surgery

## 2021-06-23 ENCOUNTER — Encounter: Payer: Self-pay | Admitting: Orthopaedic Surgery

## 2021-06-23 DIAGNOSIS — S82852D Displaced trimalleolar fracture of left lower leg, subsequent encounter for closed fracture with routine healing: Secondary | ICD-10-CM

## 2021-06-23 NOTE — Progress Notes (Signed)
? ?  Office Visit Note ?  ?Patient: Carol Daugherty           ?Date of Birth: Dec 22, 1995           ?MRN: [id number] ?Visit Date: 06/23/2021 ?             ?Requested by: No referring provider defined for this encounter. ?PCP: Pcp, No ? ? ?Assessment & Plan: ?Visit Diagnoses:  ?1. Closed displaced trimalleolar fracture of left ankle with routine healing, subsequent encounter   ? ? ?Plan: The FCE was valid and showed consistent participation and effort.  Overall level of work is determined to be in the medium range.  These results were reviewed with Ms. Carol Daugherty in detail as well as the case Production designer, theatre/television/film.  She is released back to work with these restrictions.  Her impairment rating is 35% which is broken down into 10% for the surgery and 25% for the limitation in range of motion.  At this point we will see her back as needed.  Case manager present today. ? ?Total face to face encounter time was greater than 25 minutes and over half of this time was spent in counseling and/or coordination of care. ? ?Follow-Up Instructions: No follow-ups on file.  ? ?Orders:  ?No orders of the defined types were placed in this encounter. ? ?No orders of the defined types were placed in this encounter. ? ? ? ? Procedures: ?No procedures performed ? ? ?Clinical Data: ?No additional findings. ? ? ?Subjective: ?Chief Complaint  ?Patient presents with  ? Left Ankle - Follow-up  ?  FCE review  ? ? ?HPI ? ?Carol Daugherty returns today to go over the recent FCE results.  She underwent ORIF of a left trimalleolar ankle fracture on 08/27/2020.  Case manager present today. ? ?Review of Systems ? ? ?Objective: ?Vital Signs: There were no vitals taken for this visit. ? ?Physical Exam ? ?Ortho Exam ? ?Examination of the left ankle is unchanged. ? ?Specialty Comments:  ?No specialty comments available. ? ?Imaging: ?No results found. ? ? ?PMFS History: ?Patient Active Problem List  ? Diagnosis Date Noted  ? Closed displaced trimalleolar fracture of left lower leg   ?  Surgery, elective   ? ?Past Medical History:  ?Diagnosis Date  ? Trimalleolar fracture of ankle, open, left, sequela   ?  ?No family history on file.  ?Past Surgical History:  ?Procedure Laterality Date  ? EXTERNAL FIXATION LEG Left 08/20/2020  ? Procedure: EXTERNAL FIXATION LEFT ANKLE;  Surgeon: Tarry Kos, MD;  Location: [facility];  Service: Orthopedics;  Laterality: Left;  ? EXTERNAL FIXATION REMOVAL Left 08/27/2020  ? Procedure: REMOVAL OF EXTERNAL FIXATOR;  Surgeon: Tarry Kos, MD;  Location: [facility];  Service: Orthopedics;  Laterality: Left;  ? ORIF ANKLE FRACTURE Left 08/27/2020  ? Procedure: OPEN REDUCTION INTERNAL FIXATION (ORIF) ANKLE FRACTURE;  Surgeon: Tarry Kos, MD;  Location: [facility];  Service: Orthopedics;  Laterality: Left;  ? ?Social History  ? ?Occupational History  ? Not on file  ?Tobacco Use  ? Smoking status: Never  ? Smokeless tobacco: Never  ?Vaping Use  ? Vaping Use: Never used  ?Substance and Sexual Activity  ? Alcohol use: Never  ? Drug use: Never  ? Sexual activity: Not on file  ? ? ? ? ? ? ?

## 2023-04-24 IMAGING — XA DG ANKLE COMPLETE 3+V*L*
1 series · 4 of 4 positions shown · non-contrast
Comparison: 08/19/2020.

CLINICAL DATA: ORIF left ankle.

EXAM:
DG C-ARM 1-60 MIN; LEFT ANKLE COMPLETE - 3+ VIEW
FLUOROSCOPY TIME:  Fluoroscopy Time:  1 minutes 3 seconds.
Radiation Exposure Index (if provided by the fluoroscopic device):
2.39 mGy.
Number of Acquired Spot Images: 4

[Series 1: unknown protocol · left · 0.30mm/px · 4 of 4 slices shown]
[im 1/4]
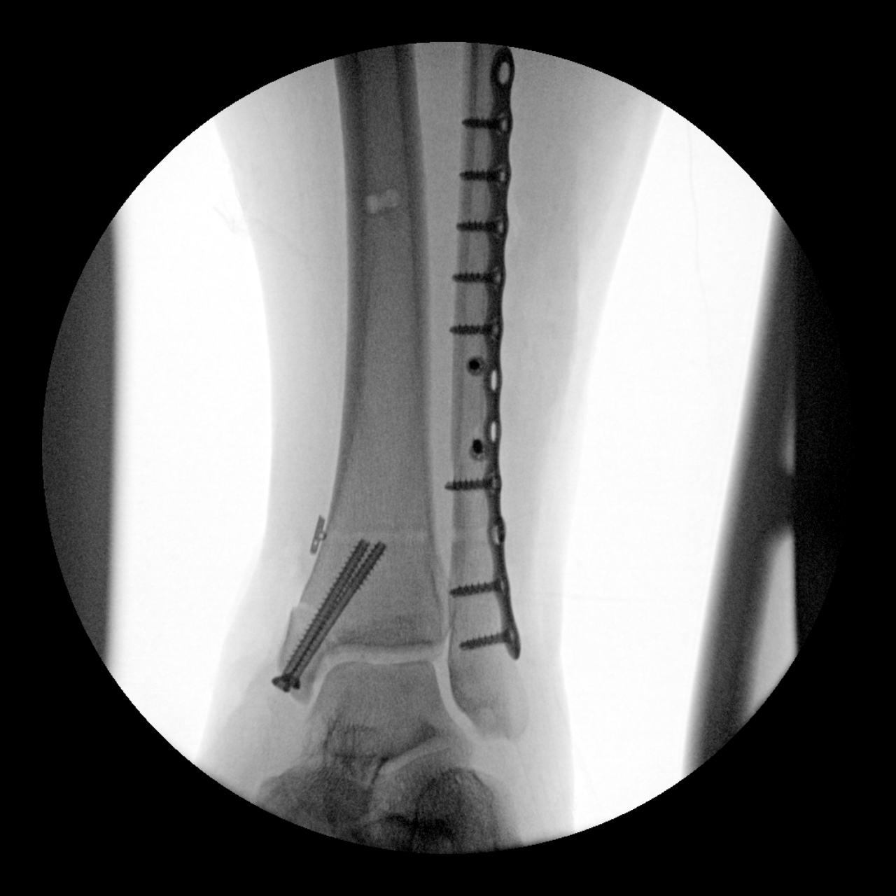
[im 2/4]
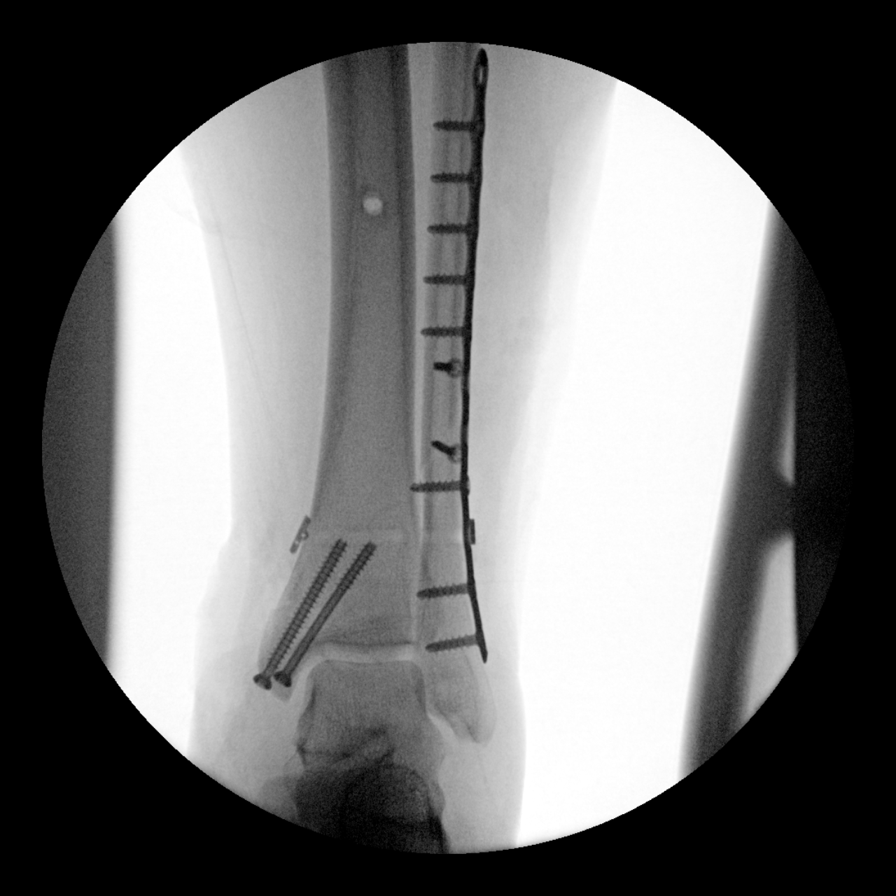
[im 3/4]
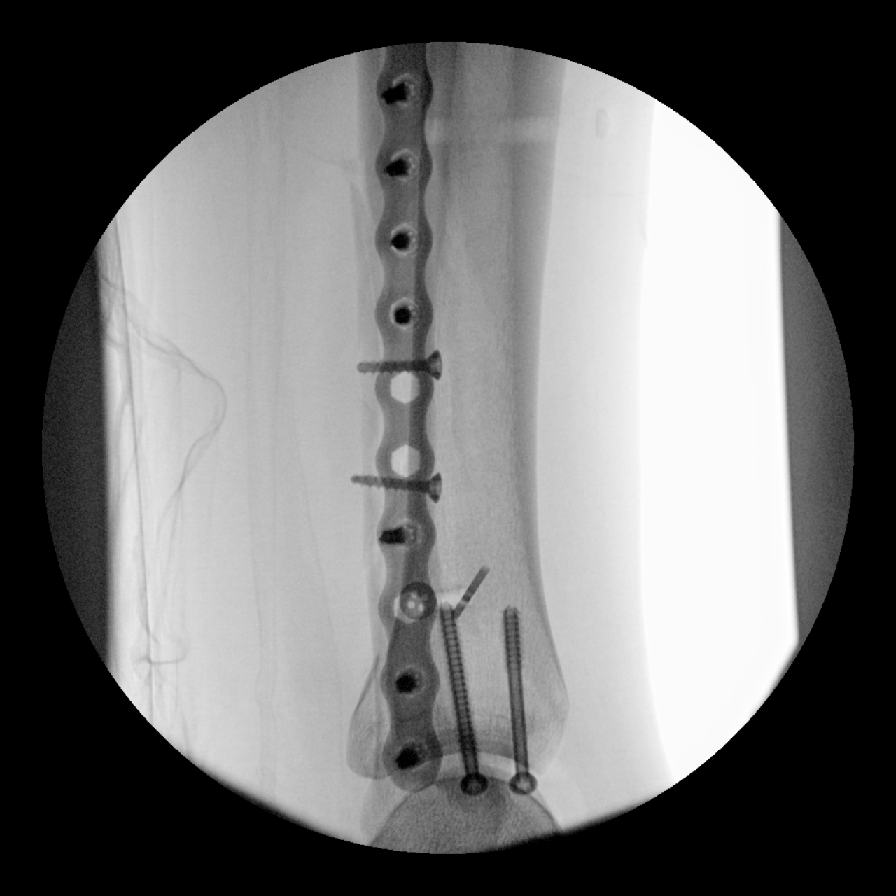
[im 4/4]
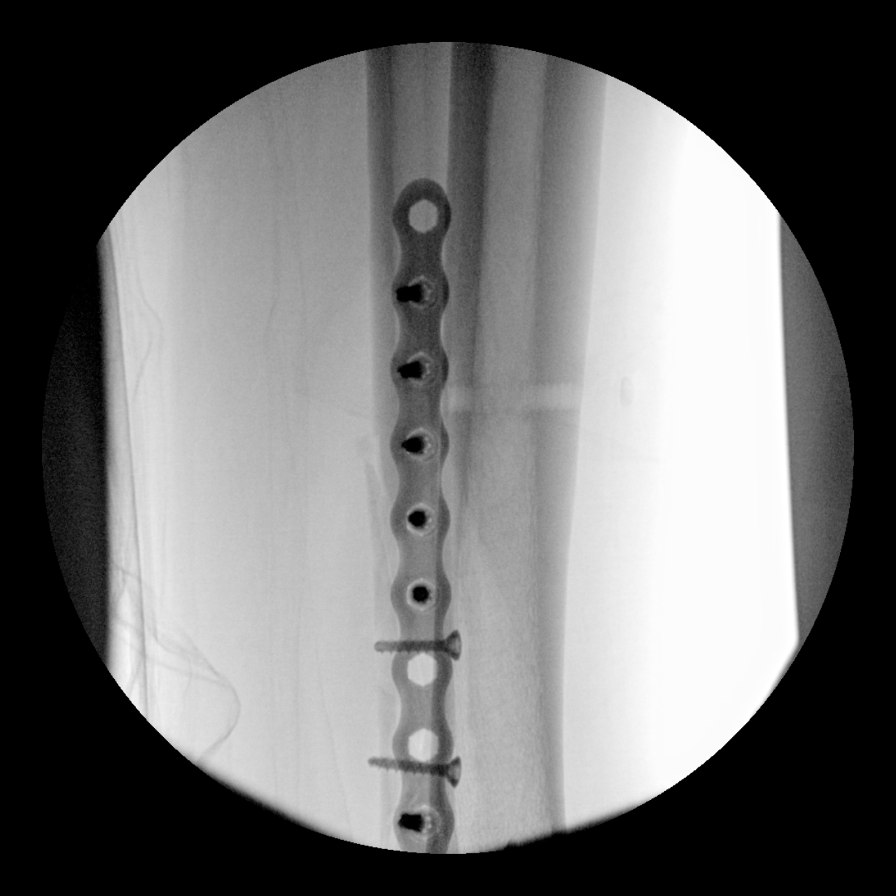

[4 of 4 positions shown; findings below may reference images not displayed]

FINDINGS: Four C-arm fluoroscopic images were obtained intraoperatively and
submitted for post operative interpretation. These images
demonstrate plate and screw fixation of the distal fibular fracture,
two screw fixation of the medial malleolar fracture and tight rope
repair of the syndesmosis. Alignment appears improved, near
anatomic. Please see the performing provider's procedural report for
further detail.
IMPRESSION: Intraoperative fluoroscopy, as detailed above.
